# Patient Record
Sex: Female | Born: 1935 | Race: Black or African American | Hispanic: No | Smoking: Never smoker
Health system: Southern US, Community
[De-identification: ages and names within clinical notes are randomized; demographics above are authoritative.]

## PROBLEM LIST (undated history)

## (undated) DIAGNOSIS — I639 Cerebral infarction, unspecified: Secondary | ICD-10-CM

## (undated) DIAGNOSIS — E119 Type 2 diabetes mellitus without complications: Secondary | ICD-10-CM

## (undated) DIAGNOSIS — C801 Malignant (primary) neoplasm, unspecified: Secondary | ICD-10-CM

## (undated) DIAGNOSIS — I480 Paroxysmal atrial fibrillation: Secondary | ICD-10-CM

## (undated) DIAGNOSIS — I10 Essential (primary) hypertension: Secondary | ICD-10-CM

## (undated) HISTORY — PX: ABDOMINAL HYSTERECTOMY: SHX81

## (undated) HISTORY — DX: Type 2 diabetes mellitus without complications: E11.9

---

## 1997-06-23 ENCOUNTER — Ambulatory Visit (HOSPITAL_COMMUNITY): Admission: RE | Admit: 1997-06-23 | Discharge: 1997-06-23 | Payer: Self-pay | Admitting: Gynecology

## 1997-12-21 ENCOUNTER — Other Ambulatory Visit: Admission: RE | Admit: 1997-12-21 | Discharge: 1997-12-21 | Payer: Self-pay | Admitting: Gynecology

## 1997-12-21 ENCOUNTER — Ambulatory Visit: Admission: RE | Admit: 1997-12-21 | Discharge: 1997-12-21 | Payer: Self-pay | Admitting: Gynecology

## 1998-04-03 ENCOUNTER — Encounter: Payer: Self-pay | Admitting: Gynecology

## 1998-04-03 ENCOUNTER — Ambulatory Visit (HOSPITAL_COMMUNITY): Admission: RE | Admit: 1998-04-03 | Discharge: 1998-04-03 | Payer: Self-pay | Admitting: Gynecology

## 1998-04-11 ENCOUNTER — Ambulatory Visit: Admission: RE | Admit: 1998-04-11 | Discharge: 1998-04-11 | Payer: Self-pay | Admitting: Gynecology

## 1998-04-11 ENCOUNTER — Other Ambulatory Visit: Admission: RE | Admit: 1998-04-11 | Discharge: 1998-04-11 | Payer: Self-pay | Admitting: Gynecology

## 1998-07-26 ENCOUNTER — Ambulatory Visit: Admission: RE | Admit: 1998-07-26 | Discharge: 1998-07-26 | Payer: Self-pay | Admitting: Gynecology

## 1998-07-27 ENCOUNTER — Other Ambulatory Visit: Admission: RE | Admit: 1998-07-27 | Discharge: 1998-07-27 | Payer: Self-pay | Admitting: Neurology

## 1998-08-02 ENCOUNTER — Encounter: Payer: Self-pay | Admitting: Gynecology

## 1998-08-02 ENCOUNTER — Ambulatory Visit (HOSPITAL_COMMUNITY): Admission: RE | Admit: 1998-08-02 | Discharge: 1998-08-02 | Payer: Self-pay | Admitting: Gynecology

## 1998-10-12 ENCOUNTER — Ambulatory Visit (HOSPITAL_COMMUNITY): Admission: RE | Admit: 1998-10-12 | Discharge: 1998-10-12 | Payer: Self-pay | Admitting: Gynecology

## 1998-10-12 ENCOUNTER — Encounter: Payer: Self-pay | Admitting: Gynecology

## 1998-10-17 ENCOUNTER — Ambulatory Visit: Admission: RE | Admit: 1998-10-17 | Discharge: 1998-10-17 | Payer: Self-pay | Admitting: Gynecology

## 1998-10-19 ENCOUNTER — Other Ambulatory Visit: Admission: RE | Admit: 1998-10-19 | Discharge: 1998-10-19 | Payer: Self-pay | Admitting: Gynecology

## 1999-04-11 ENCOUNTER — Ambulatory Visit: Admission: RE | Admit: 1999-04-11 | Discharge: 1999-04-11 | Payer: Self-pay | Admitting: Gynecology

## 1999-04-11 ENCOUNTER — Other Ambulatory Visit: Admission: RE | Admit: 1999-04-11 | Discharge: 1999-04-11 | Payer: Self-pay | Admitting: Gynecology

## 1999-09-12 ENCOUNTER — Encounter: Payer: Self-pay | Admitting: *Deleted

## 1999-09-12 ENCOUNTER — Ambulatory Visit (HOSPITAL_COMMUNITY): Admission: RE | Admit: 1999-09-12 | Discharge: 1999-09-12 | Payer: Self-pay | Admitting: *Deleted

## 1999-10-08 ENCOUNTER — Ambulatory Visit (HOSPITAL_COMMUNITY): Admission: RE | Admit: 1999-10-08 | Discharge: 1999-10-08 | Payer: Self-pay | Admitting: Gynecology

## 1999-10-09 ENCOUNTER — Ambulatory Visit (HOSPITAL_COMMUNITY): Admission: RE | Admit: 1999-10-09 | Discharge: 1999-10-09 | Payer: Self-pay | Admitting: Gynecology

## 1999-10-09 ENCOUNTER — Encounter: Payer: Self-pay | Admitting: Gynecology

## 1999-10-10 ENCOUNTER — Other Ambulatory Visit: Admission: RE | Admit: 1999-10-10 | Discharge: 1999-10-10 | Payer: Self-pay | Admitting: Gynecology

## 1999-10-10 ENCOUNTER — Ambulatory Visit: Admission: RE | Admit: 1999-10-10 | Discharge: 1999-10-10 | Payer: Self-pay | Admitting: Gynecology

## 2000-04-09 ENCOUNTER — Ambulatory Visit: Admission: RE | Admit: 2000-04-09 | Discharge: 2000-04-09 | Payer: Self-pay | Admitting: Gynecology

## 2000-04-09 ENCOUNTER — Other Ambulatory Visit: Admission: RE | Admit: 2000-04-09 | Discharge: 2000-04-09 | Payer: Self-pay | Admitting: Gynecology

## 2000-07-27 ENCOUNTER — Emergency Department (HOSPITAL_COMMUNITY): Admission: EM | Admit: 2000-07-27 | Discharge: 2000-07-27 | Payer: Self-pay | Admitting: Emergency Medicine

## 2000-10-06 ENCOUNTER — Ambulatory Visit (HOSPITAL_COMMUNITY): Admission: RE | Admit: 2000-10-06 | Discharge: 2000-10-06 | Payer: Self-pay | Admitting: Gynecology

## 2000-10-07 ENCOUNTER — Ambulatory Visit (HOSPITAL_COMMUNITY): Admission: RE | Admit: 2000-10-07 | Discharge: 2000-10-07 | Payer: Self-pay | Admitting: Gynecology

## 2000-10-07 ENCOUNTER — Encounter: Payer: Self-pay | Admitting: Gynecology

## 2000-10-14 ENCOUNTER — Other Ambulatory Visit: Admission: RE | Admit: 2000-10-14 | Discharge: 2000-10-14 | Payer: Self-pay | Admitting: Gynecology

## 2000-10-14 ENCOUNTER — Ambulatory Visit: Admission: RE | Admit: 2000-10-14 | Discharge: 2000-10-14 | Payer: Self-pay | Admitting: Gynecology

## 2000-10-17 ENCOUNTER — Ambulatory Visit (HOSPITAL_COMMUNITY): Admission: RE | Admit: 2000-10-17 | Discharge: 2000-10-17 | Payer: Self-pay | Admitting: Gynecology

## 2000-10-17 ENCOUNTER — Encounter: Payer: Self-pay | Admitting: Gynecology

## 2001-04-14 ENCOUNTER — Other Ambulatory Visit: Admission: RE | Admit: 2001-04-14 | Discharge: 2001-04-14 | Payer: Self-pay | Admitting: Gynecology

## 2001-04-14 ENCOUNTER — Ambulatory Visit: Admission: RE | Admit: 2001-04-14 | Discharge: 2001-04-14 | Payer: Self-pay | Admitting: Gynecology

## 2001-04-14 ENCOUNTER — Encounter (INDEPENDENT_AMBULATORY_CARE_PROVIDER_SITE_OTHER): Payer: Self-pay | Admitting: *Deleted

## 2001-10-02 ENCOUNTER — Ambulatory Visit (HOSPITAL_COMMUNITY): Admission: RE | Admit: 2001-10-02 | Discharge: 2001-10-02 | Payer: Self-pay | Admitting: Gynecology

## 2001-10-06 ENCOUNTER — Ambulatory Visit (HOSPITAL_COMMUNITY): Admission: RE | Admit: 2001-10-06 | Discharge: 2001-10-06 | Payer: Self-pay | Admitting: Gynecology

## 2001-10-06 ENCOUNTER — Encounter: Payer: Self-pay | Admitting: Gynecology

## 2001-12-01 ENCOUNTER — Encounter (INDEPENDENT_AMBULATORY_CARE_PROVIDER_SITE_OTHER): Payer: Self-pay | Admitting: *Deleted

## 2001-12-01 ENCOUNTER — Ambulatory Visit: Admission: RE | Admit: 2001-12-01 | Discharge: 2001-12-01 | Payer: Self-pay | Admitting: Gynecology

## 2001-12-01 ENCOUNTER — Other Ambulatory Visit: Admission: RE | Admit: 2001-12-01 | Discharge: 2001-12-01 | Payer: Self-pay | Admitting: Gynecology

## 2002-11-17 ENCOUNTER — Ambulatory Visit: Admission: RE | Admit: 2002-11-17 | Discharge: 2002-11-17 | Payer: Self-pay | Admitting: Gynecology

## 2002-11-17 ENCOUNTER — Encounter (INDEPENDENT_AMBULATORY_CARE_PROVIDER_SITE_OTHER): Payer: Self-pay | Admitting: *Deleted

## 2002-11-17 ENCOUNTER — Other Ambulatory Visit: Admission: RE | Admit: 2002-11-17 | Discharge: 2002-11-17 | Payer: Self-pay | Admitting: Gynecology

## 2003-09-03 ENCOUNTER — Emergency Department (HOSPITAL_COMMUNITY): Admission: EM | Admit: 2003-09-03 | Discharge: 2003-09-03 | Payer: Self-pay | Admitting: Emergency Medicine

## 2003-11-23 ENCOUNTER — Other Ambulatory Visit: Admission: RE | Admit: 2003-11-23 | Discharge: 2003-11-23 | Payer: Self-pay | Admitting: Gynecology

## 2003-11-23 ENCOUNTER — Encounter (INDEPENDENT_AMBULATORY_CARE_PROVIDER_SITE_OTHER): Payer: Self-pay | Admitting: *Deleted

## 2003-11-23 ENCOUNTER — Ambulatory Visit: Admission: RE | Admit: 2003-11-23 | Discharge: 2003-11-23 | Payer: Self-pay | Admitting: Gynecology

## 2004-10-22 ENCOUNTER — Ambulatory Visit (HOSPITAL_COMMUNITY): Admission: RE | Admit: 2004-10-22 | Discharge: 2004-10-22 | Payer: Self-pay | Admitting: Gastroenterology

## 2004-12-04 ENCOUNTER — Ambulatory Visit: Admission: RE | Admit: 2004-12-04 | Discharge: 2004-12-04 | Payer: Self-pay | Admitting: Gynecology

## 2004-12-04 ENCOUNTER — Encounter (INDEPENDENT_AMBULATORY_CARE_PROVIDER_SITE_OTHER): Payer: Self-pay | Admitting: *Deleted

## 2004-12-04 ENCOUNTER — Other Ambulatory Visit: Admission: RE | Admit: 2004-12-04 | Discharge: 2004-12-04 | Payer: Self-pay | Admitting: Gynecology

## 2006-05-14 ENCOUNTER — Encounter (INDEPENDENT_AMBULATORY_CARE_PROVIDER_SITE_OTHER): Payer: Self-pay | Admitting: *Deleted

## 2006-05-14 ENCOUNTER — Ambulatory Visit: Admission: RE | Admit: 2006-05-14 | Discharge: 2006-05-14 | Payer: Self-pay | Admitting: Gynecology

## 2006-05-14 ENCOUNTER — Other Ambulatory Visit: Admission: RE | Admit: 2006-05-14 | Discharge: 2006-05-14 | Payer: Self-pay | Admitting: Gynecology

## 2006-12-24 ENCOUNTER — Inpatient Hospital Stay (HOSPITAL_COMMUNITY): Admission: EM | Admit: 2006-12-24 | Discharge: 2007-01-08 | Payer: Self-pay | Admitting: Emergency Medicine

## 2006-12-25 ENCOUNTER — Encounter (INDEPENDENT_AMBULATORY_CARE_PROVIDER_SITE_OTHER): Payer: Self-pay | Admitting: Internal Medicine

## 2006-12-27 ENCOUNTER — Ambulatory Visit: Payer: Self-pay | Admitting: Surgery

## 2006-12-31 ENCOUNTER — Encounter: Payer: Self-pay | Admitting: Surgery

## 2007-01-27 ENCOUNTER — Encounter: Admission: RE | Admit: 2007-01-27 | Discharge: 2007-01-27 | Payer: Self-pay | Admitting: Surgery

## 2007-01-27 ENCOUNTER — Ambulatory Visit: Payer: Self-pay | Admitting: Surgery

## 2007-05-26 ENCOUNTER — Other Ambulatory Visit: Admission: RE | Admit: 2007-05-26 | Discharge: 2007-05-26 | Payer: Self-pay | Admitting: Gynecology

## 2007-05-26 ENCOUNTER — Ambulatory Visit: Admission: RE | Admit: 2007-05-26 | Discharge: 2007-05-26 | Payer: Self-pay | Admitting: Gynecology

## 2007-05-26 ENCOUNTER — Encounter: Payer: Self-pay | Admitting: Gynecology

## 2008-05-06 IMAGING — CR DG CHEST 2V
2 series · 2 of 2 positions shown · non-contrast
Comparison: 01/02/07.

CLINICAL DATA: Status post median sternotomy. 
 TWO VIEW CHEST:

[w chest pa]
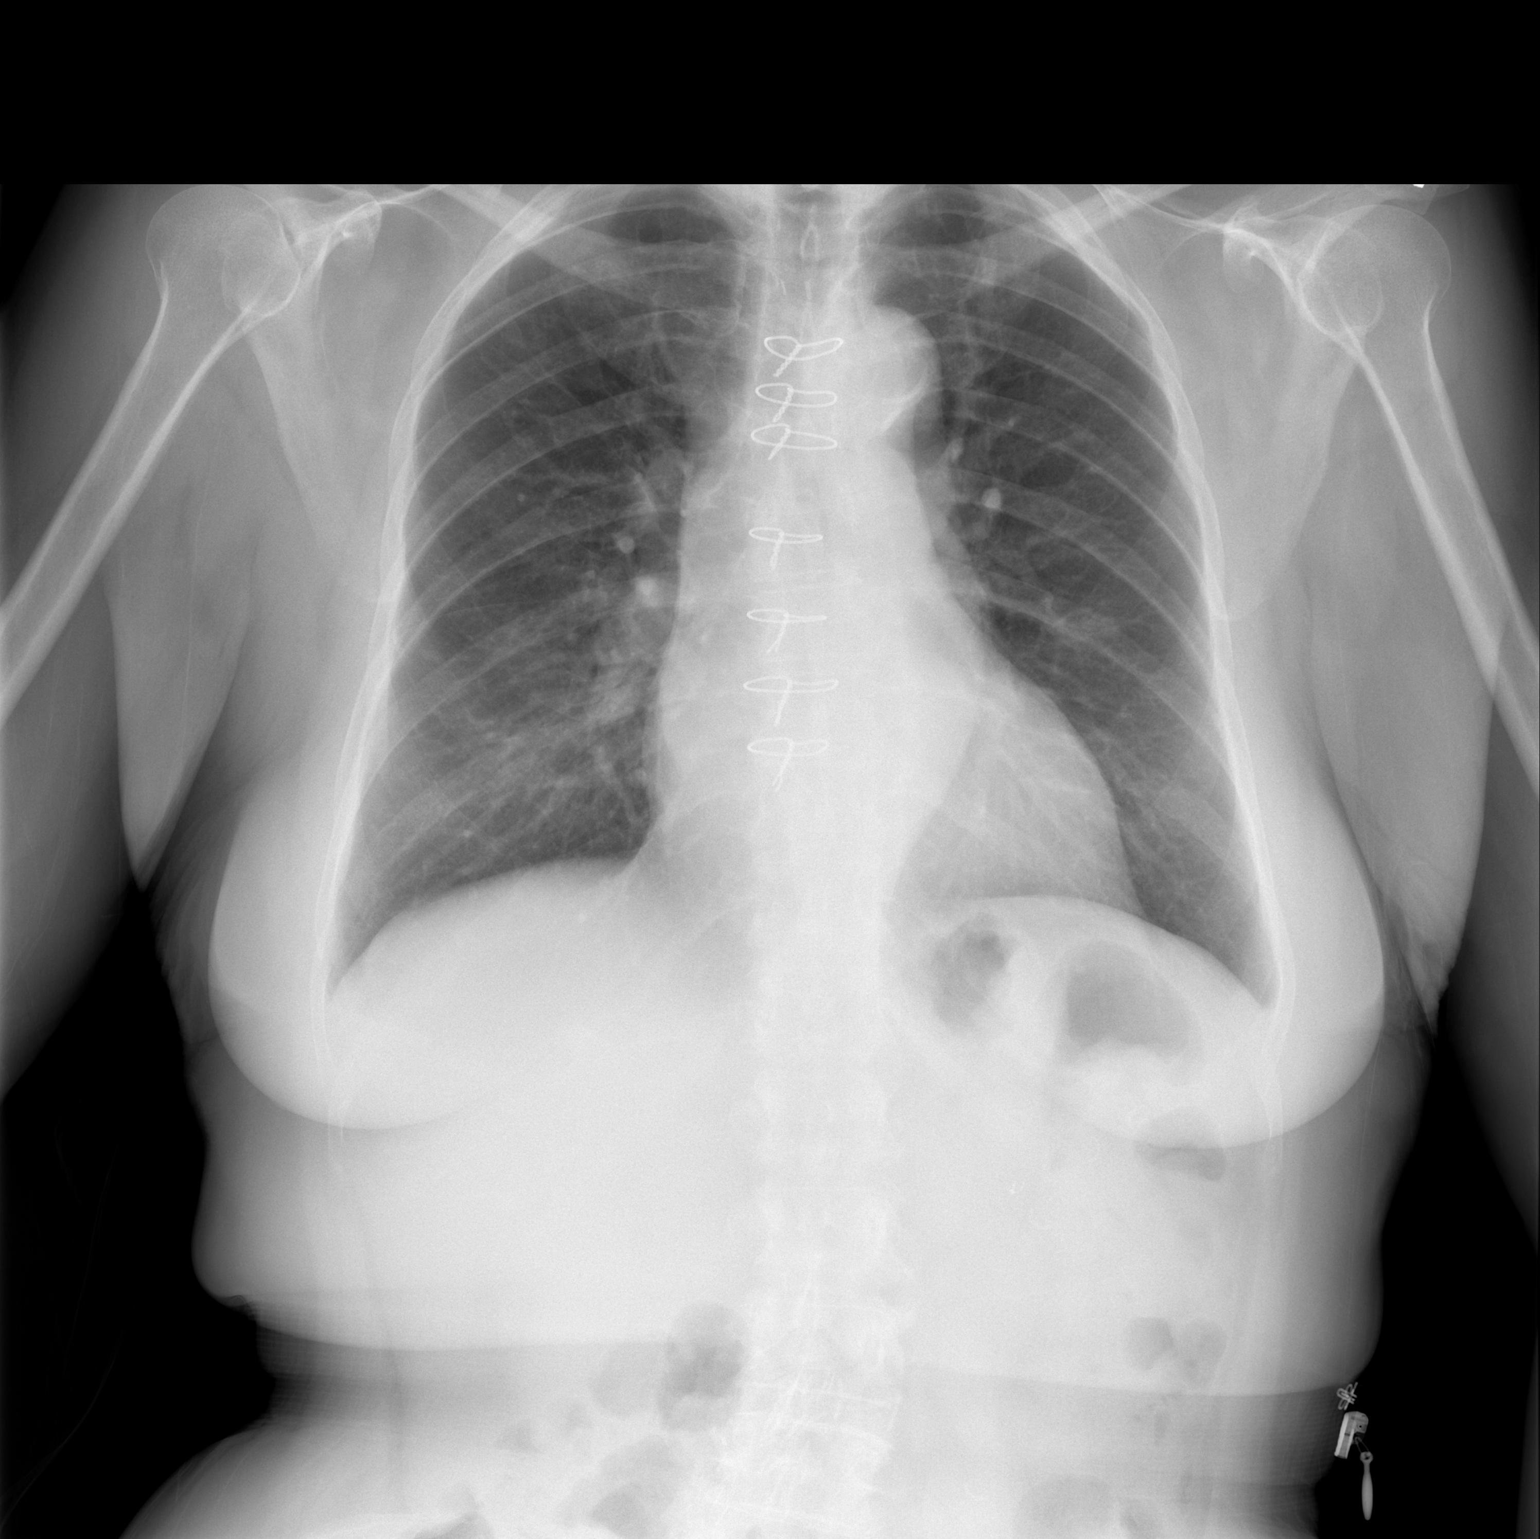

[w chest lat]
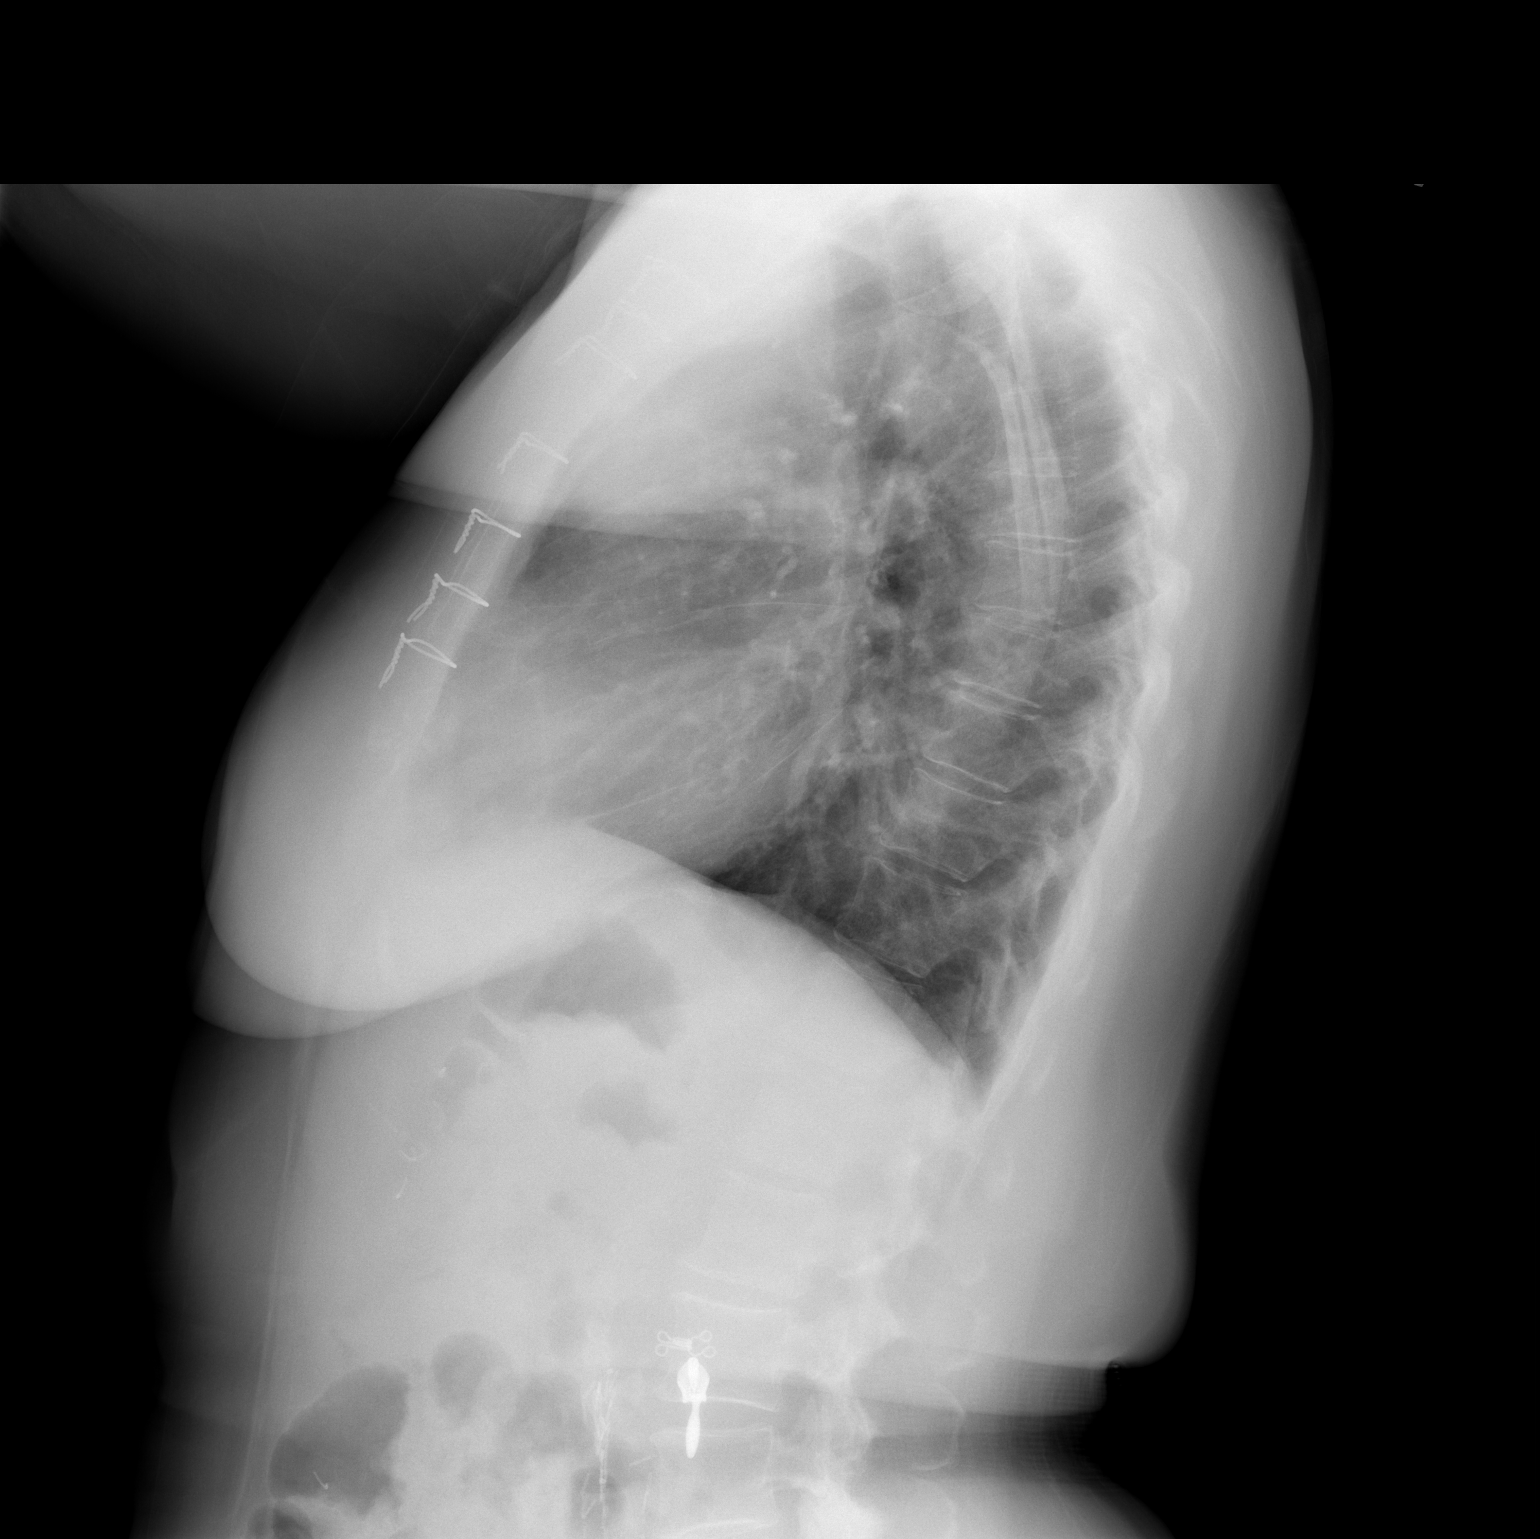

[2 of 2 positions shown; findings below may reference images not displayed]

The patient has undergone a previous median sternotomy.  The heart is normal in size.  The mediastinal structures have a normal appearance postoperatively. There is no pneumothorax and the lungs are well aerated postoperatively.
IMPRESSION: Satisfactory postoperative chest.

## 2008-06-11 ENCOUNTER — Emergency Department (HOSPITAL_COMMUNITY): Admission: EM | Admit: 2008-06-11 | Discharge: 2008-06-11 | Payer: Self-pay | Admitting: Emergency Medicine

## 2008-07-03 ENCOUNTER — Encounter: Admission: RE | Admit: 2008-07-03 | Discharge: 2008-07-03 | Payer: Self-pay | Admitting: Internal Medicine

## 2008-07-07 ENCOUNTER — Encounter: Admission: RE | Admit: 2008-07-07 | Discharge: 2008-07-07 | Payer: Self-pay | Admitting: Internal Medicine

## 2008-07-22 ENCOUNTER — Ambulatory Visit (HOSPITAL_COMMUNITY): Admission: RE | Admit: 2008-07-22 | Discharge: 2008-07-22 | Payer: Self-pay | Admitting: Internal Medicine

## 2010-03-12 ENCOUNTER — Ambulatory Visit: Payer: Self-pay | Admitting: Cardiology

## 2010-03-12 ENCOUNTER — Ambulatory Visit (HOSPITAL_COMMUNITY)
Admission: RE | Admit: 2010-03-12 | Discharge: 2010-03-12 | Payer: Self-pay | Source: Home / Self Care | Admitting: Cardiology

## 2010-07-25 ENCOUNTER — Other Ambulatory Visit: Payer: Self-pay | Admitting: Internal Medicine

## 2010-07-25 DIAGNOSIS — Z1231 Encounter for screening mammogram for malignant neoplasm of breast: Secondary | ICD-10-CM

## 2010-08-14 LAB — POCT CARDIAC MARKERS
CKMB, poc: <1 ng/mL — ABNORMAL LOW (ref 1.0–8.0)
Troponin i, poc: <0.05 ng/mL (ref 0.00–0.09)

## 2010-08-17 ENCOUNTER — Ambulatory Visit
Admission: RE | Admit: 2010-08-17 | Discharge: 2010-08-17 | Disposition: A | Discharge status: Home / Self Care | Payer: Medicare Other | Source: Ambulatory Visit | Attending: Internal Medicine | Admitting: Internal Medicine

## 2010-08-17 DIAGNOSIS — Z1231 Encounter for screening mammogram for malignant neoplasm of breast: Secondary | ICD-10-CM

## 2010-09-11 NOTE — Assessment & Plan Note (Signed)
OFFICE VISIT   Brandi Griffin, Brandi Griffin  DOB:  [birth year]                                        January 27, 2007  CHART #:  [id number]   OFFICE VISIT:  The patient returns for follow up status post resection  of a large left atrial tumor with reconstruction of the intraatrial  septum and posterior left atrial wall using a pericardial patch on  12/31/2006.  She had an uncomplicated postoperative course.  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  She  had an uneventful postoperative recovery and said that she has been  feeling  much better since discharge.  She has noted that her blood  pressure has been rising slowly.  She was not sent home on her blood  pressure medication due to her relatively low blood pressure in the  hospital.  She denies any chest pain or shortness of breath.   PHYSICAL EXAMINATION:  On physical examination, her blood pressure  186/90.  Repeat was 178/80.  Pulse is 92 and regular.  Respiratory rate  is 18 and unlabored.  Oxygen saturation is 95% on room air.  She looks  well.  Cardiac exam shows a regular rate and rhythm with normal heart  sounds.  Her lung exam is clear.  The chest incision is healing well and  the sternum is stable.  There is no peripheral edema.   Follow up chest x-ray shows clear lung fields and no pleural effusions.   CURRENT MEDICATIONS:  Lipitor 40 mg daily, Activa 50 mg daily,  Glucophage 1000 mg two times a day, aspirin, and a multivitamin daily.  I asked her to resume her Lisinopril 40 mg daily and hydrochlorothiazide  25 mg daily, which she had been on preoperatively for hypertension.   IMPRESSION:  Overall the patient is recovering well from her surgery.  She is hypertensive and I asked her to resume her previous blood  pressure medications.  She has a follow up appointment with Dr. Amil Amen  this Thursday so she can have her blood pressure rechecked and any  further  adjustments made.  She had a completely resected left atrial  myxoma.  Her risk of recurrence is very low.  She probably should have a  repeat echo in about one year to follow up on this to ensure there are  no signs of recurrence at that time.  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  She should refrain from lifting anything  heavier than 10 pounds for a total of 3 months due to surgery.  She will  continue to follow up with Dr. Amil Amen and Dr. Nehemiah Settle, and will contact  me if she develops any problems with her incisions.   Evelene Croon, M.D.  Electronically Signed   BB/MEDQ  D:  01/27/2007  T:  01/27/2007  Job:  [id number]   cc:   Francisca December, M.D.  Deirdre Peer. Polite, M.D.

## 2010-09-11 NOTE — Consult Note (Signed)
NAME:  Brandi Griffin, Brandi Griffin NO.:  [id number]   MEDICAL RECORD NO.:  [id number]          PATIENT TYPE:  OUT   LOCATION:  GYN                          FACILITY:  [facility]   PHYSICIAN:  De Blanch, M.D.DATE OF BIRTH:  1935-01-19   DATE OF CONSULTATION:  05/26/2007  DATE OF DISCHARGE:  05/26/2007                                 CONSULTATION   CHIEF COMPLAINT:  Endometrial cancer.   HISTORY:  The patient returns today for continuing follow-up of stage IV  endometrial cancer.  Since her last visit she has done well.  She denies  any GI or GU symptoms.  There is no pelvic pain or pressure, vaginal  bleeding or discharge.   The patient did have a significant health event over this past year, and  was found to have a myxoma in her heart requiring open heart surgery.  She seems to have recovered well from this.   HISTORY OF PRESENT ILLNESS:  The patient underwent initial surgery at  [facility] February 1998.  She was found to have pelvic and  periaortic lymph node metastasis and was treated with extended field  radiation therapy and Megace.  She is also found to have pulmonary  metastasis which responded completely to Megace.   PAST MEDICAL HISTORY/MEDICAL ILLNESSES:  1. Diabetes.  2. Hyperlipidemia.  3. Hypertension.  4. Cardiac myxoma.   DRUG ALLERGIES:  None.   FAMILY HISTORY:  Negative for gynecologic, breast or colon cancer.   CURRENT MEDICATIONS:  Actos, Norvasc, insulin, metformin,  hydrochlorothiazide, lisinopril and Crestor.   SOCIAL HISTORY:  The patient lives with a daughter.  She does not smoke.   REVIEW OF SYSTEMS:  A 10-point coverage review of systems negative  except as noted above.   PHYSICAL EXAMINATION:  VITAL SIGNS:  Weight 143 pounds, blood pressure  150/72.  GENERAL:  Patient is a healthy African American female no acute  distress.  HEENT:  Negative.  NECK:  Supple without thyromegaly or supraclavicular, clavicular or  inguinal adenopathy.  ABDOMEN:  Soft, nontender.  No mass, organomegaly, ascites or hernias  noted.  PELVIC:  EG/BUS vagina, urethra are normal.  Cervix and uterus are  surgically absent.  Adnexa without masses.  Rectovaginal exam confirms.  LOWER EXTREMITIES:  Without edema or varicosities.   IMPRESSION:  Stage IV endometrial cancer 1998.  No evidence of recurrent  disease.  The patient will return to see me in one year.  Pap smears  were obtained today.      De Blanch, M.D.  Electronically Signed     DC/MEDQ  D:  05/28/2007  T:  05/28/2007  Job:  [id number]   cc:   Deirdre Peer. Polite, M.D.   Telford Nab, R.N.  [address]  [place], [place] [address]

## 2010-09-11 NOTE — H&P (Signed)
NAMESHANTA, HARTNER NO.:  [id number]   MEDICAL RECORD NO.:  [id number]          PATIENT TYPE:  EMS   LOCATION:  ED                           FACILITY:  [facility]   PHYSICIAN:  Hollice Espy, M.D.DATE OF BIRTH:  1934/07/09   DATE OF ADMISSION:  12/24/2006  DATE OF DISCHARGE:                              HISTORY & PHYSICAL   PRIMARY CARE PHYSICIAN:  Deirdre Peer. Polite, M.D.   CHIEF COMPLAINT:  Shortness of breath.   HISTORY OF PRESENT ILLNESS:  The patient is a 76 year old white female  with past medical history of hypertension, diabetes mellitus, and  hyperlipidemia who has been feeling well with no complaints for the last  several days.  She denies any fevers, chills, cough, shortness of  breath, lower extremity swelling and seems to be otherwise doing well  until today when she noted that she seemed to be short of breath.  She  had no chest pain and again no other symptoms, but appeared to be with  any kind of exertion she started feeling short of breath.  It got to the  point where she felt extremely winded doing even small activities and  came into the emergency room for further evaluation.  In the emergency  room, when walking across the ER to her room she felt even short of  breath then.  She was noted to be having normal oxygen saturations with  saturations of 98-100% on 2 liters to even room air.  Labs were ordered  on the patient and she was found to have normal electrolytes and renal  function, normal white count with no shift.  Her hemoglobin was  unremarkable.  Her BNP was only minimally elevated at 234 and a D-dimer  was slightly elevated at 0.98.  Cardiac markers were fine as was EKG.  The patient underwent a chest x-ray which showed nonspecific bibasilar  infiltrates consistent with a possible atypical pneumonia.  Because of  her elevation in D-dimer and sudden onset of shortness of breath,  Lear Ng, M.D., the ER physician was concerned  about the  possibility of a pulmonary embolism and had the patient undergo CT  angiogram of the chest.  Preliminary report from the radiologist stated  that the CT scan while negative for pulmonary embolism, showed a  possible mass in the right atrium concerning for a possible tumor such  as a myxoma.  With these findings, I discussed this with the patient and  we felt it was best that she come in for further testing.  Currently she  herself is doing well.  She denies any headaches, vision changes,  dysphagia, chest pain, chest pain, palpitations.  She says her breathing  is much better now, but she does admit, however, that she is sitting in  bed and has oxygen on, but denies any wheezing, coughing, abdominal  pain, hematuria, dysuria, constipation, diarrhea, focal extremity  numbness, weakness, pain, or swelling.   REVIEW OF SYSTEMS:  Otherwise negative.   PAST MEDICAL HISTORY:  Diabetes mellitus, hypertension, hyperlipidemia,  and history of endometrial cancer with metastasis to the lung, status  post full treatment and she has been cancer free for almost 10 years.   ALLERGIES:  No known drug allergies.   SOCIAL HISTORY:  She denies any tobacco, alcohol, or drug use.   MEDICATIONS:  1. Actos 30 mg.  2. Crestor 20 mg.  3. Hydrochlorothiazide 25 mg.  4. Lisinopril 40 mg.  5. Metformin 2000 mg.  6. Multivitamin.   FAMILY HISTORY:  Noncontributory.   PHYSICAL EXAMINATION:  VITAL SIGNS:  Temperature 98.5, heart rate 78,  blood pressure 134/70, respirations 20, O2 saturation 100% on room air,  and 98% on 2 liters.  GENERAL:  She is alert and oriented x3 in no acute distress.  HEENT:  Normocephalic and atraumatic.  Mucous membranes are moist.  She  has no carotid bruits.  HEART:  Regular rhythm with a noted S3 or S4 murmur difficult to  ascertain, but she definitely has a split S2 heart sound.  She, however,  tells me that this has gone on for most of her life and this is not  a  new finding.  LUNGS:  She has decreased breath sounds at the bases.  ABDOMEN:  Soft, nontender, obese, positive bowel sounds.  EXTREMITIES:  No cyanosis, clubbing, or edema.  She has good 2+ pulses.   X-ray and CT are as HPI although the final report on her CT is not back  yet.   LABORATORY DATA:  Sodium 139, potassium 3.7, chloride 104, bicarb 29,  BUN 12, creatinine 0.7, glucose 145.  White count 9.1, H&H 12.5 and 37,  MCV 81, platelet count 350 with no shift.  Coags are unremarkable.  D-  dimer is slightly elevated at 0.98.  BNP is slightly elevated at 234.  CPK 92.6, MB 1.5, troponin I less than 0.05.   ASSESSMENT:  1. Atrial mass.  This may possibly be the cause of the patient's      shortness of breath and/or dyspnea issues.  We will plan to admit      the patient.  I will discuss with cardiology tonight as to whether      we need a two-dimensional echocardiogram versus a TEE to best      ascertain.  This is concerning in a patient with a previous history      of carcinoma.  We will need further follow-up.  In addition, we      will also be able to assess with her echocardiogram her ejection      fraction.  2. Dyspnea on exertion, sudden onset.  Noted the patient's x-ray this      may possibly end up being an atypical pneumonia with no findings      per echo.  If that is the case, we will treat symptomatically.      Again because of her slightly elevated BNP, her echocardiogram will      best assess her cardiac function.  3. Diabetes mellitus.  Continue medications plus sliding scale.  4. Hypertension.  Continue medications.      Hollice Espy, M.D.  Electronically Signed     SKK/MEDQ  D:  12/24/2006  T:  12/25/2006  Job:  [id number]   cc:   Deirdre Peer. Polite, M.D.

## 2010-09-11 NOTE — Cardiovascular Report (Signed)
Brandi, Griffin             ACCOUNT NO.:  [id number]   MEDICAL RECORD NO.:  [id number]          PATIENT TYPE:  INP   LOCATION:  3732                         FACILITY:  [facility]   PHYSICIAN:  Francisca December, M.D.  DATE OF BIRTH:  07-09-1934   DATE OF PROCEDURE:  12/30/2006  DATE OF DISCHARGE:                            CARDIAC CATHETERIZATION   PROCEDURE PERFORMED:  1. Right and left heart catheterization.  2. Left ventriculogram.  3. Coronary angiography.  4. Percutaneous closure of right femoral artery.   INDICATIONS:  [name] is a 76 year old woman who was admitted to  [facility] after an episode of severe dyspnea.  This prompted  a CT angiogram of the chest which identified a left atrial mass.  Subsequent echocardiography has identified this as a mobile large left  atrial myxoma.  She is therefore brought to the catheterization  laboratory at this time to identify the extent of disease and provide  for further therapeutic options.   PROCEDURAL NOTE:  The patient was brought to the cardiac catheterization  laboratory in the fasting state.  The right groin was prepped and draped  the usual sterile fashion.  Local anesthesia was obtained with  infiltration of 1% lidocaine.  The 6-French catheter sheaths were  inserted percutaneously into the right femoral artery and right femoral  vein utilizing an anterior approach over guiding J-wire.  A 100-cm  multipurpose A1 catheter was then manipulated through the right heart  chambers to the pulmonary artery wedge position.  Pressure was recorded  with the catheter in the pulmonary artery wedge position, the main  pulmonary artery, the right ventricle, and the right atrium.  Blood  samples for oxygen saturation determination were obtained from the  superior vena cava and the pulmonary artery.  A 110-cm pigtail catheter  was then advanced to the ascending aorta, where pressures were recorded,  and a blood sample obtained  for oxygen saturation.  The catheter was  then prolapsed across the aortic valve and the left ventricle and  simultaneous wedge and LV tracings were obtained.  The right heart  catheter was then removed from the wedge position and a 30-degree RAO  cine left ventriculogram performed utilizing a power injector.  Forty  milliliters were injected at 12 mL per second.  Pressures were recorded  as the pigtail catheter was withdrawn back across the aortic valve and  it was then removed and exchanged for a 6-French #3.5 left Judkins  catheter.  Cineangiography of the left coronary artery was conducted in  LAO and RAO projections.  The left Judkins catheter was exchanged for a  6-French #4 right Judkins catheter.  Cineangiography of the right  coronary artery was conducted in LAO and RAO projections.  There was  catheter damping on cannulation of the right coronary and intracoronary  nitroglycerin 0.2 mg was administered.  This did relieve some of the  catheter damping, but did not completely resolve it.  Several angiograms  in the LAO projection while pullback of the catheter failed to reveal  any significant lesion; this was after administration of IV  nitroglycerin.  A right femoral arteriogram was then performed in the RAO projection via  the catheter sheath by hand injection; this documented adequate anatomy  for placement of the percutaneous closure device, Angio-Seal.  This was  subsequently deployed with good hemostasis and intact distal pulse.  The  patient was then transported to the recovery area in stable condition.   HEMODYNAMIC RESULTS:  Right heart pressures were as follows:  Right  atrial pressure A wave 7 mmHg, V wave 5 mmHg, mean 4 mmHg.  Right  ventricular pressure was 59/5 mmHg.  Pulmonary pressure was 56/18 mmHg  with a mean of 35 mmHg.  Pulmonary artery wedge position was A wave 26  mmHg, V wave 33 mmHg, mean 23 mmHg.  Aortic pressure:  Central aortic  pressure was 126/63  with a mean of 89 mmHg.  Left ventricular end-  diastolic pressure was 6 mmHg.   The superior vena cava oxygen saturation was 63%.  Pulmonary artery  saturation was 66% and aortic saturation 93%.  This resulted in a Fick  cardiac output of 3.5 L per minute.  The transmitral mean gradient was  16.4 mmHg.  Utilizing the Fick principle and the Gorlin formula, a  calculated effective mitral valve area was 0.72 cm2.   ANGIOGRAPHY:  The left ventriculogram demonstrated normal chamber size  and hyperdynamic systolic function.  A visual estimate of the ejection  fraction due to the presence of ventricular tachycardia is 70%.  There  are no regional wall motion abnormalities.  There is mitral annular  calcification seen and the aortic valve is trileaflet, but does open  normally during systole.  There is no coronary artery calcification  seen.   There is a right-dominant coronary system present.  The main left  coronary artery is normal.   The left anterior descending artery and its branches are normal.  Two  small to moderate-sized diagonal branches arise without any obstruction  seen.  The ongoing anterior descending artery reaches and traverses the  apex.   The left circumflex coronary artery and its branches are also without  any obstruction.  There is a large ramus intermedius that is the  dominant vessel on the lateral wall of the heart.  The left circumflex  itself is relatively small and gives rise to one small to moderate-sized  marginal branch, and then a very small posterolateral branch.   The right coronary artery, as mentioned initially, demonstrate a  pressure damping and a 30% to 40% tubular narrowing at the ostium.  The  right coronary itself is somewhat tortuous, but I see no obstructions  whatsoever.  Distally, the vessel subdivides into a moderate to large  posterior descending artery and a moderate-to-large posterolateral  segment which gives rise to 1 large  posterolateral branch.  Following  intracoronary nitroglycerin, a slow pullback to the right coronary  catheter revealed perhaps a 20% narrowing at the ostium of the right  coronary.  As mentioned, there was still minor pressure damping at the  completion.   FINAL IMPRESSION:  1. Essentially normal coronary arteries, minimal atherosclerotic      disease of the right coronary ostium.  2. Intact left ventricular size and global systolic function.  3. Moderately elevated right ventricular and pulmonary artery      pressures with significantly elevated pulmonary wedge pressure      secondary to left atrial outflow obstruction by the myxoma.  4. Effective mitral valve area is 0.7 due to obstruction with the  myxoma.   PLAN/RECOMMENDATIONS:  The case will be referred to [facility] [place] and Dr. Evelene Croon is aware.      Francisca December, M.D.  Electronically Signed     JHE/MEDQ  D:  12/30/2006  T:  12/30/2006  Job:  [id number]   cc:   Deirdre Peer. Polite, M.D.  Evelene Croon, M.D.

## 2010-09-11 NOTE — Consult Note (Signed)
NAMECHARLY, HUNTON NO.:  [id number]   MEDICAL RECORD NO.:  [id number]          PATIENT TYPE:  INP   LOCATION:  3732                         FACILITY:  [facility]   PHYSICIAN:  Evelene Croon, M.D.     DATE OF BIRTH:  23-Nov-1934   DATE OF CONSULTATION:  12/27/2006  DATE OF DISCHARGE:                                 CONSULTATION   REFERRING PHYSICIAN:  Francisca December, M.D.   REASON FOR CONSULTATION:  Left atrial tumor.   CLINICAL HISTORY:  I was asked to evaluate this 76 year old woman with a  large left atrial tumor for consideration of surgery.  She says that she  was in her usual state of health until a couple days prior to admission  when she began developing worsening shortness of breath.  This was  associated with any type of exertion.  She presented to the emergency  room for further evaluation, known to have normal oxygen saturation on 2  liters and then on room air.  Her BNP was minimally elevated at 234 and  her D-dimer was slightly elevated at 0.98.  Cardiac enzymes and  electrocardiogram were normal.  Chest x-ray showed nonspecific bibasilar  infiltrates consistent with possible atypical pneumonia.  She underwent  a CT scan of the chest due to her sudden onset of shortness of breath  and her elevated D-dimer.  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  She was admitted for further workup and cardiology consultation.  A 2D  echocardiogram was performed and this showed a large left atrial tumor  that appeared to be mobile and partially obstructing the mitral valve.  It was filling the majority of an enlarged left atrium.  There was  significant turbulence around the mass.  There was no significant mitral  regurgitation, but was a gradient of 13 mmHg across the valve.  Left  ventricular function was well preserved.  She was seen by Dr. Amil Amen of  [facility] and cardiac  catheterization was recommended, but the  patient wanted to wait until her daughter was here.   REVIEW OF SYSTEMS:  GENERAL: She has had no fever or chills.  She denies  any recent weight changes.  She denies fatigue.  EYES:  Negative.  ENT:  Negative. ENDOCRINE: She has adult onset diabetes.  She denies  hypothyroidism.  CARDIOVASCULAR:  She denies any chest pain or pressure.  She has had recent onset of progressive exertional dyspnea.  In  retrospect she said that for at least 6 to 12 months she has had some  mild shortness of breath with exertion like going up stairs.  She has  had no PND or orthopnea.  She does have some peripheral edema, which she  attributes to taking Actos.  She denies palpations.  RESPIRATORY:  She  denies cough and sputum production.  She has had no hemoptysis.  GI:  She denies nausea or vomiting.  She has had no melena or bright red  blood per rectum.  GU:  She denies dysuria  and hematuria.  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  NEUROLOGICAL:  She  denies any focal weakness or numbness.  She denies dizziness and  syncope.  She has had no visual changes and no headaches.  PSYCHIATRIC:  Negative.  MUSCULOSKELETAL:  Negative.  HEMATOLOGIC:  She denies any  history of bleeding, sores, or easy bruising.   ALLERGIES:  NONE.   MEDICATIONS:  Prior to admission were:  1. Actos 30 mg daily.  2. Crestor 20 mg daily.  3. HCTZ 25 mg daily.  4. Lisinopril 40 mg daily.  5. Metformin 2000 mg daily.  6. Multivitamin daily.   PAST MEDICAL HISTORY:  Significant for:  1. Diabetes.  2. History of hypertension.  3. Hyperlipidemia.  4. She has history of stage 4 endometrial cancer with metastasis to      the lung status post radicle hysterectomy and postoperative      radiation.  She has been cancer free for almost 10 years.   SOCIAL HISTORY:  She had not smoked in over 20  years.  She denies  alcohol use.  She is single and lives alone.   FAMILY HISTORY:  Negative.   PHYSICAL EXAMINATION:  GENERAL:  She is a well-developed, mildly obese,  black female in no distress.  VITAL SIGNS:  Blood pressure is 135/75 and her pulse is 80 and regular,  respiratory rate is 18 and labored.  HEENT:  Normocephalic, atraumatic.  Pupils are equal and reactive to  light and accommodation.  Extraocular muscles are intact.  Throat is  clear.  NECK:  Shows normal carotid pulses bilaterally.  There are no bruits.  There is no adenopathy or thyromegaly.  CARDIAC:  Shows regular rate and rhythm with normal S1, S2.  There is no  murmur, rub, or gallop.  LUNGS:  Clear.  ABDOMEN:  Shows active bowel sounds.  Abdomen is soft, obese, and  nontender.  There are no palpable masses, organomegaly.  EXTREMITIES:  Shows some mild edema in the lower legs and feet.  Pedal  pulses are palpable bilaterally.  SKIN:  Warm and dry.  NEURO:  Shows to be alert and oriented x3.  Motor and sensory exams are  grossly normal.   LABORATORY DATA:  On examination reveals normal electrolytes with a BUN  of 12, creatinine 0.67.  Glucose is 145.  Coagulation profile is normal.  Hemoglobin 12.5, platelet count is 350,000, white blood cell count 9.1.  Her hemoglobin A1c was 6.6 on admission.   A CT scan of the chest showed a 5 cm filling defect in the left atrium  suspicious for left atrial myxoma.  There was no evidence of pulmonary  emboli.  There was a small amount of pericardial fluid.  The thoracic  aorta appeared normal and no arthrosclerotic changes.  There is small  bilateral pleural effusion in the bibasilar atelectasis.  There is a  patchy crown glass opacity suggesting partial airspace filling process,  possible edema, possible infectious or inflammatory alveolitis.  There  is also small pulmonary nodules, which were seen on previous CT scan  from 2003.  Electrocardiogram shows normal sinus  rhythm with possible  left atrial enlargement.   IMPRESSION:  Ms. Kettlewell has a large left atrial tumor that is mobile and  obstructing the left atrium and mitral valve.  She has a recent onset of  progressive symptoms.  She is  currently stable, walking the halls in the  hospital.  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  Surgical resection is the recommended  treatment.  She is scheduled to undergo cardiac catheterization on  Tuesday and we will make plans for surgery while she is here in the  hospital.  I discussed the CT scan and echocardiogram findings with the  patient and her daughter.  We discussed the surgical procedure,  alternatives, benefits, and risks including but not limited to bleeding,  blood transfusion, infection, stroke, myocardial infarction, recurrence  of the tumor, and death.  Also discussed the possibility of needing  coronary bypass surgery depending on the catheterization findings.  I  did discuss  the possibility that this tumor was invading the mitral valve itself  requiring partial resection of the valve and repair or possibly even  replacing the valve to allow complete surgical excision of the tumor.  They understand all of this and agree to proceed.      Evelene Croon, M.D.  Electronically Signed     BB/MEDQ  D:  12/27/2006  T:  12/28/2006  Job:  [id number]   cc:   Deirdre Peer. Polite, M.D.  Francisca December, M.D.

## 2010-09-11 NOTE — Op Note (Signed)
Brandi Griffin, Brandi Griffin             ACCOUNT NO.:  [id number]   MEDICAL RECORD NO.:  [id number]          PATIENT TYPE:  INP   LOCATION:  2309                         FACILITY:  [facility]   PHYSICIAN:  Evelene Croon, M.D.     DATE OF BIRTH:  06-Sep-1934   DATE OF PROCEDURE:  12/31/2006  DATE OF DISCHARGE:                               OPERATIVE REPORT   PREOPERATIVE AND POSTOPERATIVE DIAGNOSIS:  Large left atrial tumor.   OPERATIVE PROCEDURE:  Median sternotomy, extracorporeal circulation,  resection of large left atrial tumor with reconstruction of the atrial  septum and posterior left atrial wall using a pericardial patch.   ATTENDING SURGEON:  Dr. Evelene Croon.   ASSISTANT:  Tressie Stalker, MD   ANESTHESIA:  General endotracheal.   CLINICAL HISTORY:  This patient is a 76 year old woman who presented  with progressive shortness of breath.  An echocardiogram showed a large  left atrial tumor, suggesting a myxoma that had a diameter of about 5  cm.  This was filling up the majority of the left atrium and impinging  on the mitral valve with atrial systole.  She underwent cardiac  catheterization which showed no significant coronary disease.  There is  normal left ventricular function.  There was pulmonary hypertension with  a gradient across the mitral valve measured at about 16 mmHg.  After  review of these studies and examination of the patient I felt that  surgical resection of this left atrial tumor was the best treatment.  I  discussed the operative procedure with the patient and her family  including alternatives, benefits, and risks including bleeding, blood  transfusion, infection, stroke, myocardial infarction, heart block  requiring permanent pacemaker, possible need for mitral valve repair or  replacement depending on the location of the tumor, and death.  They  understood all this and agreed to proceed.   OPERATIVE PROCEDURE:  The patient was taken off the room and placed  on  table in supine position.  After induction of general endotracheal  anesthesia, a Foley catheter was placed in bladder using sterile  technique.  Preoperative intravenous antibiotics were given.  Then the  chest, abdomen and both lower extremities were prepped and draped in  usual sterile manner.  A transesophageal echocardiogram was performed by  anesthesiology.  This clearly showed the large left atrial tumor,  suggesting a myxoma.  It was not possible to see exactly where it was  attached.  There is some suspicion that there may be a small stalk  attached to the anterior mitral leaflet or the interatrial septum, but  it was not clear.  There was good left ventricular function.  There is  mild mitral regurgitation.  PA pressure was markedly elevated with PAD  of about 40.   Then the chest was opened through a median sternotomy incision.  The  pericardium opened in the midline.  Examination of the heart showed good  ventricular contractility.  The ascending aorta had no palpable plaques  in it.  Then the patient was heparinized and when an adequate activated  clotting time was achieved, the distal ascending aorta  was cannulated  using a 20-French aortic cannula for arterial inflow.  The venous  outflow was achieved using bicaval venous cannulation with 24-French  metal tipped right angle cannula placed through a pursestring suture in  the superior vena cava and a 32-French plastic right-angle cannula  placed through a pursestring in the low right atrium.  Antegrade  cardioplegia and vent cannula was inserted in the aortic root.  The  superior and inferior vena cavae were encircled with elastic tapes.  The  patient was then placed on cardiopulmonary bypass and temperature  allowed to drift down to 34 degrees centigrade.  The aorta was then  crossclamped and 500 mL of cold blood antegrade cardioplegia was  administered in the aortic root with quick arrest the heart.  Topical   hypothermia with iced saline was used.  An insulating pad was placed in  the pericardium.  Temperature probe placed in the septum.  Additional  doses of antegrade cardioplegia were given at about 30-minute intervals  to maintain myocardial temperature around 10 degrees centigrade.   Then the superior and inferior vena caval tapes were snugged.  The right  atrium was opened through an oblique incision beginning at the right  atrial appendage and continued down along the lateral wall of the right  atrium.  The fossa ovalis was identified and the septum was opened  through this area.  The left atrial tumor did not appear to be attached  to this area.  The interatrial septum was opened inferiorly.  It was  possible to examine the mitral valve and the tumor did not appear to  attach to the mitral valve at all.  The septum was then opened more  cephalad and it was apparent that the tumor was actually attached to the  upper portion the septum as well as down onto the posterior wall of the  left atrium.  In order to allow complete resection of the base of the  tumor where it attached to the atrial wall, it was necessary to extend  the septal incision down across the dome of left atrium.  This provided  good exposure and a section of the septum and posterior wall of the left  atrium were resected to allow complete excision of the site of  attachment of the left atrial tumor.  The tumor and this section of  atrial wall were sent to the pathologist separately.  After this  resection it left a large defect in the posterior wall of the left  atrium as well as the upper septum.  Then a patch of the patient's  pericardium was prepared and fixed in Cidex solution for about 2  minutes.  It was rinsed according to protocol.  It was cut to the  appropriate fashion.  This patch of pericardium was then used to  reconstruct the interatrial septum as well as the posterior wall of the  left atrium.  It was  sutured to these areas with continuous 3-0 Prolene  suture in two layers.  After reconstruction of these areas, the  atriotomy incision was closed using continuous 3-0 Prolene suture.  The  atriotomy incision was coated with Coseal for hemostasis.  The patient  rewarmed to 37 degrees centigrade.  After de-airing of the left side of  the heart.  The head was placed in Trendelenburg position and the  crossclamp was removed with time of 134 minutes.  There was spontaneous  return of a slow junctional rhythm.  Two temporary right ventricular  and  right atrial pacing wires placed and brought out through the skin.  When  the patient had rewarmed to 37 degrees centigrade, she was weaned from  cardiopulmonary bypass on no inotropic agents.  Total bypass time was  150 minutes.  Transesophageal echocardiogram showed normal left  ventricular function.  There was no mitral regurgitation.  There is  trace aortic insufficiency.  The septal patch was easily identified.  There was no evidence of leak across the septum.  PA pressures were  within normal limits.  Then protamine was given and the venous and  aortic cannulas removed without difficulty.  Hemostasis was achieved  without difficulty.  Two chest tubes were placed with a tube in the post  pericardium and one  in the anterior mediastinum.  The sternum was then  closed with #6 stainless steel wires.  The fascia was closed with  continuous #1 Vicryl suture.  Subcu tissue was closed with continuous 2-  0 Vicryl and skin with 3-0 Vicryl subcuticular closure.  The sponge,  needle and instrument counts were correct according to scrub nurse.  Dry  sterile dressing applied over the incisions and around the chest tubes  which were hooked to Pleur-Evac suction.  The patient remained  hemodynamically stable and was transported to the SICU in guarded but  stable condition.      Evelene Croon, M.D.  Electronically Signed     BB/MEDQ  D:  12/31/2006  T:   01/01/2007  Job:  [id number]   cc:   Francisca December, M.D.

## 2010-09-14 NOTE — Consult Note (Signed)
NAME:  Brandi Griffin, Brandi Griffin NO.:  [id number]   MEDICAL RECORD NO.:  [id number]          PATIENT TYPE:  OUT   LOCATION:  GYN                          FACILITY:  [facility]   PHYSICIAN:  De Blanch, M.D.DATE OF BIRTH:  [birth year]   DATE OF CONSULTATION:  12/04/2004  DATE OF DISCHARGE:                                   CONSULTATION   This 76 year old African-American female returns continuing follow-up of  stage IVb endometrial cancer.   INTERVAL HISTORY:  Since her last visit, the patient has done well. She has  no GI or GU symptoms; has no pelvic pain, pressure, vaginal bleeding, or  discharge. She had a colonoscopy recently which negative. She has also had a  bone density study but the results are not known. She had mammograms  recently also that were negative.   HISTORY OF PRESENT ILLNESS:  The patient underwent initial surgery at [facility] February 1998. She was found to have pelvic and periaortic lymph  node metastasis and was treated with pelvic and periaortic radiation therapy  as well as Megace. She also had pulmonary metastasis treated with Megace  which responded completely on follow-up chest x-rays.   MEDICAL ILLNESSES:  Diabetes, hypertension, hyperlipidemia, obesity.   DRUG ALLERGIES:  None.   FAMILY HISTORY:  Negative for gynecologic, breast, or colon cancer.   CURRENT MEDICATIONS:  Actos, Norvasc, insulin, metformin,  hydrochlorothiazide, lisinopril, Crestor, and Megace.   SOCIAL HISTORY:  The patient lives with her daughter, does not smoke, is  retired.   REVIEW OF SYSTEMS:  A 10-point review of systems is performed and is  negative except as noted above.   PHYSICAL EXAMINATION:  VITAL SIGNS:  Weight 167 pounds (up 13 pounds from  last year), blood pressure 150/70.  GENERAL:  Patient is a pleasant, healthy African-American female in no acute  distress.  HEENT:  Negative.  NECK:  Supple without thyromegaly.  LYMPH:  There is no  supraclavicular or inguinal adenopathy.  ABDOMEN:  Soft, nontender. No mass, organomegaly, ascites or hernias are  noted.  BREASTS:  Without masses, discharge, or skin changes.  PELVIC:  EB/BUS, vagina, bladder, and urethra are normal. Cervix and are  uterus surgically absent. No lesions are noted. The cuff is well supported.  Bimanual and rectovaginal exam reveal no masses, induration or nodularity.   IMPRESSION:  Stage IVb endometrial cancer in 1998, no evidence recurrent  disease.   PLAN:  Will continue Megace 40 mg b.i.d. Pap smears are obtained today. She  will return to see me in 1 year. We will plan on discontinuing Megace after  10 years of use.      De Blanch, M.D.  Electronically Signed     DC/MEDQ  D:  12/04/2004  T:  12/05/2004  Job:  [id number]   cc:   Telford Nab, R.N.  [address]  [place], [place] [address]

## 2010-09-14 NOTE — Consult Note (Signed)
NAME:  Brandi Griffin, Brandi Griffin                    ACCOUNT NO.:  [id number]   MEDICAL RECORD NO.:  [id number]                   PATIENT TYPE:  OUT   LOCATION:  GYN                                  FACILITY:  [facility]   PHYSICIAN:  De Blanch, M.D.         DATE OF BIRTH:  1934/08/31   DATE OF CONSULTATION:  11/17/2002  DATE OF DISCHARGE:                                   CONSULTATION   REPORT TITLE:  GYNECOLOGICAL CONSULTATION/CLINIC NOTE   REFERRING PHYSICIAN:  Barbette Hair. Merril Abbe, M.D., [facility] at  [facility]   REASON FOR CONSULTATION:  This 76 year old African American female returns  for continuing followup of a stage IVB endometrial adenocarcinoma.   INTERVAL HISTORY:  Since her last visit, the patient has done well. She  denies any GI or GU symptoms. Has no pelvic pain, pressure, vaginal  bleeding, or discharge. Her functional status is excellent. She has  continued to take Megace 40 mg q.i.d.   The patient underwent initial surgery at [facility] in February 1998.  She had pulmonary and periaortic metastasis. The periaortic  nodes were  treated with radiation therapy and the patient was subsequently treated with  Megace. She had a complete response on chest x-ray and has been followed  since that time with no evidence of disease.   CURRENT MEDICATIONS:  Antihypertensives and oral agents for diabetes.   ALLERGIES:  None.   FAMILY/SOCIAL HISTORY:  Reviewed and unchanged. It is noted that the  patient's daughter recently finished nursing school and is studying for her  nursing boards.   REVIEW OF SYSTEMS:  Reveals no cardiovascular, pulmonary, gastrointestinal,  genitourinary, musculoskeletal, or neurologic symptoms. She does have  diabetes, which is managed with oral agents.   PHYSICAL EXAMINATION:  VITAL SIGNS:  Weight 156 pounds, blood pressure  160/88.  GENERAL:  The patient is a healthy African American female in no acute  distress.  HEENT:  Negative.  NECK:  Supple without thyromegaly. There is no supraclavicular, axillary, or  inguinal adenopathy.  ABDOMEN:  Soft, nontender. No masses, organomegaly, ascites, or hernias are  noted.  PELVIC:  EG, BUS, vagina, bladder and urethra are normal. Vagina is slightly  foreshortened. Cervix and uterus surgically absent. Adnexa without masses.  Rectovaginal examination confirms. Measurable disease none.   IMPRESSION:  Stage IVB endometrial cancer 1998. No evidence of recurrent  disease.    PLAN:  Pap smears are obtained at this juncture. Given more than five years  of followup, we will diminish the patient's Megace dose to 40 mg b.i.d. She  will return to see me in one year for continuing followup.                                               De Blanch, M.D.    DC/MEDQ  D:  11/17/2002  T:  11/17/2002  Job:  [id number]   cc:   Barbette Hair., M.D., [facility] at [facility], R.N.  [address]  [place], [place] [address]

## 2010-09-14 NOTE — Consult Note (Signed)
NAME:  Brandi Griffin, Brandi Griffin                    ACCOUNT NO.:  [id number]   MEDICAL RECORD NO.:  [id number]                   PATIENT TYPE:  OUT   LOCATION:  GYN                                  FACILITY:  [facility]   PHYSICIAN:  Daniel L. Clarke-Pearson, M.D.      DATE OF BIRTH:  31-Oct-1934   DATE OF CONSULTATION:  12/01/2001  DATE OF DISCHARGE:  12/01/2001                                 GYN CONSULTATION   HISTORY OF PRESENT ILLNESS:  This 76 year old African-American female  returns for continuing follow-up of a stage IVB endometrial adenocarcinoma.  She underwent initial surgery at [facility] February 1998.  She had  pulmonary and periaortic metastases, treated with pelvic radiation therapy,  periaortic radiation therapy, and Megace.  She has apparently had a complete  response and has been followed since that time with no evidence of  recurrent disease.  The patient has had periodic CT scans.  She had one on  October 06, 2001, which shows multiple small peripheral pulmonary nodules in  both lungs which are probably stable in size, given the change in technique  from between the prior scans.  It is thought that these are most likely  postinflammatory in nature.  The remainder of the CT scan of the abdomen and  pelvis shows no evidence of recurrent disease.  The patient herself reports  she feels well.  She has no GI or GU symptoms.  Has no pelvic pain,  pressure, vaginal bleed, or discharge.   CURRENT MEDICATIONS:  Antihypertensive.  Oral agents for diabetes.   FAMILY HISTORY, SOCIAL HISTORY:  Reviewed and unchanged.   REVIEW OF SYSTEMS:  Essentially negative.   PHYSICAL EXAMINATION:  VITAL SIGNS:  Weight 160 pounds (stable).  Blood  pressure 158/102.  GENERAL:  Healthy black female in no acute distress.  HEENT:  Negative.  NECK:  Supple.  Without thyromegaly.  There is no supraclavicular or  inguinal adenopathy.  ABDOMEN:  Soft and nontender.  No masses, organomegaly,  ascites, or hernias  are noted.  PELVIC:  EGBUS, vagina, bladder, urethra are normal.  Cervix and uterus are  surgically absent.  Adnexa without masses.  RECTOVAGINAL:  Confirms.   IMPRESSION:  Stage IVB endometrial cancer in 1998.  No evidence of recurrent  disease.  The patient is currently taking Megace.    PLAN:  Pap smears were obtained today.  The patient to return in one year  for continuing follow-ups.  She will continue to take Megace 40 mg q.i.d.  She was given a renewed prescription.                                                 Daniel L. Stanford Breed, M.D.    DLC/MEDQ  D:  12/01/2001  T:  12/06/2001  Job:  [id number]   cc:   Telford Nab, R.N.  Blenda Peals, M.D.  Division of Gynecologic Oncology  [facility]  [address] [id number]  [place], [place] [address]

## 2010-09-14 NOTE — Op Note (Signed)
NAME:  CALVINA, LIPTAK NO.:  [id number]   MEDICAL RECORD NO.:  [id number]          PATIENT TYPE:  AMB   LOCATION:  ENDO                         FACILITY:  [facility]   PHYSICIAN:  Danise Edge, M.D.   DATE OF BIRTH:  1935-01-13   DATE OF PROCEDURE:  10/22/2004  DATE OF DISCHARGE:                                 OPERATIVE REPORT   PROCEDURE:  Screening colonoscopy.   PROCEDURE INDICATIONS:  Ms. Robbi Spells is a 76 year old female born  1934/08/15. Ms. Kreiser is scheduled to undergo her first screening  colonoscopy with polypectomy to prevent colon cancer.   ENDOSCOPIST:  Danise Edge, M.D.   PREMEDICATION:  Versed 5 mg, Demerol 50 mg.   PROCEDURE:  After obtaining informed consent, Ms. Danley was placed in the  left lateral decubitus position. I administered intravenous Demerol and  intravenous Versed to achieve conscious sedation for the procedure. The  patient's blood pressure, oxygen saturation and cardiac rhythm were  monitored throughout the procedure and documented in the medical record.   Anal inspection and digital rectal exam were normal. The Olympus adjustable  pediatric colonoscope was introduced into the rectum and advanced to the  cecum. Colonic preparation for the exam today was satisfactory.   Rectum normal. Retroflex view of the distal rectum normal.  Sigmoid colon and descending colon normal.  Splenic flexure normal.  Transverse colon normal.  Hepatic flexure normal.  Ascending colon normal.  Cecum and ileocecal valve normal.   ASSESSMENT:  Normal screening proctocolonoscopy to cecum.       MJ/MEDQ  D:  10/22/2004  T:  10/22/2004  Job:  [id number]

## 2010-09-14 NOTE — H&P (Signed)
[facility]  Patient:    Brandi Griffin, Brandi Griffin                 MRN: [id number] Adm. Date:  [id number] Attending:  Jeannette Corpus CC:         Lum Babe, M.D.             Telford Nab, R.N.                         History and Physical  GYN-ONCOLOGY PROGRAM  HISTORY:  A 76 year old African-American female who had a stage IVB endometrial adenocarcinoma, initially undergoing surgery at [facility] in February 1998.  The patient was found to have pulmonary metastases, as well as para-aortic lymphadenopathy.  She received extended field radiation therapy and was placed on Megace.  She has had a complete response to Megace.  She is taking 40 mg of Megace four times a day.  She seems to tolerating this well.  Since her last visit, she has done well.  She denies any GI or GU symptoms. She has no pelvic pain, pressure, vaginal bleeding, or discharge.  She has no pulmonary symptoms.  REVIEW OF SYSTEMS:  The patient is diabetic on Glucophage.  This seems to be under good control according to the patient.  FAMILY HISTORY AND SOCIAL HISTORY:  Reviewed and unchanged from previous notation.  She comes accompanied by her daughter today.  PHYSICAL EXAMINATION:  GENERAL:  Weight 149 pounds.  The patient is a healthy black female in no acute distress.  VITAL SIGNS:  Blood pressure 130/80.  HEENT:  Negative.  NECK:  Supple without thyromegaly.  There is no supraclavicular, axillary or inguinal adenopathy.  CHEST:  Clear to percussion and auscultation.  ABDOMEN:  Soft, nontender.  No masses, organomegaly, ascites, or hernias noted. PELVIC EXAM:  EGBUS normal.  Vagina is clean, well supported.  No lesions are noted.  Bimanual and rectovaginal exam reveal no masses, induration, or nodularity.  LABORATORY DATA:  Her laboratory work is reviewed.  The patient had a CAT scan of the chest, abdomen, and pelvis on October 09, 1999,  all of which appears to be normal with no evidence of recurrent disease.  IMPRESSION:  Stage IVB endometrial carcinoma with complete response to Megace therapy.  The patient is given a new prescription for Megace 40 mg q.i.d.  She will return to see me again in six months and we will plan a CAT scan again in one year. DD:  10/10/99 TD:  10/10/99 Job: [id number]

## 2010-09-14 NOTE — Consult Note (Signed)
[facility]  Patient:    Brandi Griffin, Brandi Griffin                 MRN: [id number] Proc. Date: 04/09/00 Adm. Date:  [id number] Disc. Date: [id number] Attending:  Jeannette Corpus CC:         Telford Nab, R.N.   Consultation Report  HISTORY OF PRESENT ILLNESS:  The patient is a 76 year old African-American female.  She has stage IVB endometrial carcinoma initially undergoing surgery at [facility] in February 1998.  She has had pulmonary metastases as well as para-aortic metastases.  She received external beam radiation therapy and subsequently has been placed on Megace.  She has a complete response to Megace.  Since her last visit she has done well.  She denies any GI or GU symptoms, has no pelvic pain/pressure, vaginal bleeding, or discharge.  She has no pulmonary symptoms.  REVIEW OF SYSTEMS:  Essentially unchanged and negative.  FAMILY HISTORY AND SOCIAL HISTORY:  Again are reviewed and unchanged.  PHYSICAL EXAMINATION:  GENERAL APPEARANCE:  The patient is a healthy female in no acute distress.  VITAL SIGNS:  Weight 154 pounds, blood pressure 152/94.  HEENT:  Negative.  NECK:  Supple without thyromegaly.  LYMPHATICS:  There is no supraclavicular, axillary, or inguinal adenopathy.  ABDOMEN:  Soft and nontender.  No masses, organomegaly, ascites, or hernias are noted.  PELVIC:  EGBUS normal.  Vagina is clean, well supported, and no masses, organomegaly, or ascites are noted.  Rectovaginal exam confirms.  IMPRESSION:  Stage IVB endometrial carcinoma with lung metastases.  The patient remains free of disease.  PLAN:  She will continue taking Megace q.i.d. (40 mg).  She will return to see me in six months for continuing follow-up. DD:  04/15/00 TD:  04/16/00 Job: [id number]

## 2010-09-14 NOTE — Consult Note (Signed)
NAME:  Brandi Griffin, Brandi Griffin NO.:  [id number]   MEDICAL RECORD NO.:  [id number]          PATIENT TYPE:  OUT   LOCATION:  GYN                          FACILITY[facility]  [facility]   PHYSICIAN:  De Blanch, M.D.DATE OF BIRTH:  10/12/1934   DATE OF CONSULTATION:  DATE OF DISCHARGE:                                 CONSULTATION   CHIEF COMPLAINT:  Endometrial cancer.   INTERVAL HISTORY:  Since her last visit, the patient has done well.  She  has been on a weight reduction diet and has had moderately good success  with it.  She had mammograms recently that were negative as well as a  colonoscopy from the last year and a bone density study.  From a  gynecologic point of view, the patient denies any pelvic pain, pressure,  vaginal bleeding or discharge.  Functional status is excellent.  She has  no GI or GU symptoms.   HISTORY OF PRESENT ILLNESS:  Patient underwent initial surgery at [facility] in February, 1998.  She was found to have pelvic and  periaortic lymph node metastasis and was treated with pelvic and  periaortic radiation therapy.  She was also treated with Megace.  She  had pulmonary metastasis treated with Megace, which responded completely  on follow-up chest x-rays.  She has been followed up since that time  with no evidence of recurrent disease.   PAST MEDICAL HISTORY:  Diabetes, hypertension, hyperlipidemia.   DRUG ALLERGIES:  None.   FAMILY HISTORY:  Negative for gynecologic, breast, or colon cancer.   CURRENT MEDICATIONS:  Actos, Norvasc, insulin, Metformin,  hydrochlorothiazide, lisinopril, Crestor, Megace.   The patient lives with her daughter.  She does not smoke.  She is  retired.   REVIEW OF SYSTEMS:  A 10-point comprehensive review of systems is  negative, except as noted above.   PHYSICAL EXAMINATION:  VITAL SIGNS:  Weight 154 pounds (down from 167  pounds a year and a half ago).  Blood pressure 130/70, pulse 80,  respiratory rate  20.  GENERAL:  Patient is a healthy African-American female in no acute  distress.  HEENT:  Negative.  NECK:  Supple without thyromegaly.  There is no supraclavicular or  inguinal adenopathy.  ABDOMEN:  Soft and nontender.  No masses, organomegaly, ascites, or  hernias are noted.  PELVIC:  EG/BUS, vagina, bladder, and urethra are normal.  The cervix  and uterus are surgically absent.  Adnexa are without masses.  Rectovaginal exam confirms.  EXTREMITIES:  Lower extremities are without edema and varicosities.   IMPRESSION:  Stage IVB endometrial carcinoma, 1998.  Patient is  clinically free of disease.  At this juncture, I think it is reasonable  to stop her Megace.  She will return to see me in one year for continued  followup.  Pap smears are obtained today.      De Blanch, M.D.  Electronically Signed     DC/MEDQ  D:  05/15/2006  T:  05/15/2006  Job:  [id number]   cc:   Telford Nab, R.N.  [address]  [place], [place] [address]  Ladell Pier, M.D.  Fax: [phone]

## 2010-09-14 NOTE — Consult Note (Signed)
[facility]  Patient:    Brandi Griffin, Brandi Griffin                 MRN: [id number] Proc. Date: 10/12/00 Adm. Date:  [id number] Attending:  Jeannette Corpus CC:         Bertram Millard. Dahlstedt, M.D.  Telford Nab, R.N.   Consultation Report  HISTORY OF PRESENT ILLNESS:  A 76 year old African-American female who returns for continuing follow-up of a stage IVB endometrial cancer.  She underwent initial surgery in February 1998 at [facility].  She was found to have pulmonary and periaortic metastases.  She received pelvic and periaortic radiation therapy followed by Megace and has had a complete response.  Since her last visit, she has done well.  She denies any GI or GU symptoms.  She has no pelvic pain, pressure, vaginal bleeding, or discharge.  REVIEW OF SYSTEMS:  Essentially negative.  She has no flank pain.  FAMILY HISTORY/SOCIAL HISTORY:  Reviewed and unchanged.  PHYSICAL EXAMINATION:  VITAL SIGNS:  Weight 157 pounds, blood pressure 150/90.  GENERAL:  The patient is a healthy black female in no acute distress.  HEENT:  Negative.  NECK:  Supple without thyromegaly.  LYMPH NODES:  There is no supraclavicular or inguinal adenopathy.  ABDOMEN:  Soft and nontender.  No masses, organomegaly, ascites, or hernias are noted.  PELVIC:  EGBUS normal.  The vagina is clean and well supported but atrophic. Bimanual and rectovaginal exam revealed no masses, induration, or nodularity. Pap smears are obtained.  EXTREMITIES:  There is no CVA tenderness.  Lower extremities without edema or varicosities.  LABORATORY DATA:  Laboratory work is reviewed.  The patient had a CAT scan of the chest, abdomen, and pelvis that shows no evidence of metastatic disease; however, on the most recent study there is a 1.8 cm diameter well-circumscribed hypodense lesion associated with the mid portion of the left kidney.  IMPRESSION: 1. Stage IVB endometrial  cancer on Megace with complete response. 2. New finding in the left kidney on CT scan.  PLAN:  Pap smears are obtained.  The patient will return to see me in six months for a follow-up, which will be five-year follow-up.  We will obtain a thin-cut CT scan of the left kidney and refer the patient to Dr. Patsi Sears for a consultation regarding the renal lesion. DD:  10/14/00 TD:  10/14/00 Job: [id number]

## 2010-09-14 NOTE — Consult Note (Signed)
NAME:  Brandi Griffin, Brandi Griffin                    ACCOUNT NO.:  [id number]   MEDICAL RECORD NO.:  [id number]                   PATIENT TYPE:  OUT   LOCATION:  GYN                                  FACILITY:  [facility]   PHYSICIAN:  De Blanch, M.D.         DATE OF BIRTH:  August 23, 1934   DATE OF CONSULTATION:  11/23/2003  DATE OF DISCHARGE:                                   CONSULTATION   REASON FOR CONSULTATION:  A 76 year old African-American female returns for  continuing followup of a stage IVB endometrial cancer initially diagnosed in  1998.   INTERVAL HISTORY:  Since her last visit, the patient has done well.  She  denies any GI or GU symptoms, has no pelvic pain, pressure, vaginal  bleeding, or discharge.  She is now taking Megace 40 mg b.i.d. and  tolerating it well.  Functional status is excellent.  She recently returned  from a family reunion in [place].   HISTORY OF PRESENT ILLNESS:  The patient underwent initial surgery at [facility] in February 1998.  She was found to have pelvic and periaortic  metastases and was treated with pelvic and periaortic radiation therapy and  Megace.  She had complete response based on chest x-ray, and has been  followed since that time.   CURRENT MEDICATIONS:  1. Antihypertensive.  2. Oral diabetic agents.   ALLERGIES:  No known drug allergies.   FAMILY HISTORY:  Reviewed and noncontributory.   SOCIAL HISTORY:  Reviewed and is negative except as noted above.   PHYSICAL EXAMINATION:  VITAL SIGNS:  Weight 154 pounds, blood pressure  154/86.  GENERAL:  The patient is a healthy black female in no acute distress.  HEENT:  Negative.  NECK:  Supple without thyromegaly.  There is no supraclavicular, axillary,  or inguinal adenopathy.  ABDOMEN:  Soft and nontender.  No masses, organomegaly, ascites, or hernias  are noted.  PELVIC:  EGBUS, vagina, bladder, and urethra are normal.  Cervix and uterus  are surgically absent.  Adnexa  without masses.  Rectovaginal examination  confirms.   IMPRESSION:  Stage IVB endometrial cancer, 1998, no evidence of recurrent  disease.   PLAN:  Pap smears are obtained.  The patient will continue taking Megace 40  mg b.i.d.  She is given a new prescription.  She will return to see [place] in  one year for continuing surveillance.                                               De Blanch, M.D.    DC/MEDQ  D:  11/23/2003  T:  11/23/2003  Job:  [id number]   cc:   Telford Nab, R.N.  [address]  [place], [place] [address]

## 2010-09-14 NOTE — Consult Note (Signed)
[facility]  Patient:    Brandi Griffin, Brandi Griffin Visit Number: [id number] MRN: [id number]          Service Type: GON Location: GYN Attending Physician:  Jeannette Corpus Dictated by:   Rande Brunt. Clarke-Pearson, M.D. Proc. Date: 04/14/01 Admit Date:  04/14/2001   CC:         Bertram Millard. Dahlstedt, M.D.  Telford Nab, R.N.  Blenda Peals, M.D.; [facility]; Dept. of OB/GYN [address]             [phone] [place], [place] [address]   Consultation Report  A 76 year old African-American female returns for continuing follow-up.  She had a stage IVB endometrial cancer initially undergoing surgery at [facility] in February 1998.  She had pulmonary and periaortic metastasis. She was treated with pelvic and periaortic radiation therapy followed by Megace and had a complete response.  She has been followed since that time with no evidence of recurrent disease.  Patient has had periodic CAT scans, the last being performed in June which was negative.  The patient has also been evaluated by Dr. Marcine Matar recently regarding a simple cyst in her left kidney which is asymptomatic.  No further therapy was recommended.  REVIEW OF SYSTEMS:  The patient has been healthy.  She has no GI, GU, cardiovascular, pulmonary, or neurologic symptoms.  FAMILY HISTORY:  Reviewed and unchanged.  SOCIAL HISTORY:  Reviewed and unchanged.  PHYSICAL EXAMINATION  VITAL SIGNS:  Weight 162 pounds, blood pressure 162/90.  GENERAL:  Patient is a healthy African-American female in no acute distress.  HEENT:  Negative.  NECK:  Supple without thyromegaly.  There is no supraclavicular or inguinal adenopathy.  ABDOMEN:  Soft and nontender.  No masses, organomegaly, ascites, or hernias noted.  PELVIC:  EGBUS:  Normal.  Vagina is atrophic, but clean.  Bimanual and rectovaginal examinations reveal no masses, induration, or  nodularity.  IMPRESSION:  Stage IVB endometrial cancer status post radiation therapy and Megace.  No evidence of recurrent disease now with nearly five years of follow-up.  PLAN:  Pap smears are obtained.  The patient will return to see [place] in six months.  Thereafter we will plan annual examination. Dictated by:   Rande Brunt. Clarke-Pearson, M.D. Attending Physician:  Jeannette Corpus DD:  04/14/01 TD:  04/14/01 Job: [id number]

## 2010-09-14 NOTE — Discharge Summary (Signed)
Brandi Griffin, Brandi Griffin             ACCOUNT NO.:  [id number]   MEDICAL RECORD NO.:  [id number]          PATIENT TYPE:  INP   LOCATION:  2002                         FACILITY:  [facility]   PHYSICIAN:  Evelene Croon, M.D.     DATE OF BIRTH:  19-Aug-1934   DATE OF ADMISSION:  12/24/2006  DATE OF DISCHARGE:  01/08/2007                               DISCHARGE SUMMARY   HISTORY OF PRESENT ILLNESS:  This patient is a 76 year old white female  with past medical history of hypertension, diabetes mellitus, and  hyperlipidemia who has been feeling well with no complaints for the last  several days.  She denied fevers, chills, cough, shortness of breath,  lower extremity swelling and seemed to be otherwise doing well until the  date of admission when she noted that she had become short of breath.  She had no chest pain and again no other symptoms, but appeared to be  with any kind of exertion she started feeling short of breath.  It got  to the point where she felt extremely winded doing even small activities  and came to the Emergency Department for further evaluation.  In the  emergency room, while walking across the ER to her room, she felt even  short of breath then.  She was noted to be having normal oxygen  saturations in the range of 98-100% on 2 liters to even room air.  Labs  were ordered and the patient was found to have normal electrolytes and  renal function, normal white cell count with no shift.  Her hemoglobin  was unremarkable.  Her BNP was only mildly elevated at 234 and D-dimer  was slightly elevated at 0.98.  Cardiac markers were unremarkable.  An  EKG was unremarkable.  Chest x-ray showed a nonspecific bibasilar  infiltrate consistent with possible atypical pneumonia.  Because of the  elevation in the D-dimer and sudden onset of shortness of breath, Dr.  Oletta Lamas, the ER physician, was concerned about possible pulmonary embolism  and she was sent for CT angio of the chest which  preliminary studies  showed no pulmonary embolism, but showed a possible mass at the right  atrium concerning for possible tumor such as myxoma.  She was then felt  to require admission for further evaluation and treatment.   Review of symptoms otherwise negative.   Please see the history and physical for further details.   PAST MEDICAL HISTORY:  1. Diabetes mellitus.  2. Hypertension.  3. Hyperlipidemia.  4. History of endometrial cancer with metastasis to the lung status      post full treatment and has been cancer-free x10 years.   ALLERGIES:  No known drug allergies.   MEDICATIONS PRIOR TO ADMISSION:  1. Actos 30 mg daily.  2. Crestor 20 mg daily.  3. Hydrochlorothiazide 25 mg daily.  4. Lisinopril 40 mg daily.  5. Metformin 2000 mg daily.  6. Multivitamin one daily.   For family history, social history and physical exam, please see the  history and physical done at the time of admission.   HOSPITAL COURSE:  The patient was admitted as  stated.  A Cardiology  consultation was obtained with [facility].  It was their opinion  that she needed cardiac catheterization to define her coronary anatomy  before resection of this atrial mass.  A Surgical consultation was also  obtained by Dr. Evelene Croon.  Dr. Laneta Simmers evaluated the studies and  recommended surgical excision of the lesion.  The catheterization was  done on 12/30/2006.  It showed no significant coronary artery disease.  Further discussion was undertaken with the patient regarding surgery and  she agreed with recommendations to proceed.  On December 31, 2006, the  following procedure was performed.   PROCEDURE:  Resection of large left atrial tumor, reconstruction of  septum and posterior wall of the left atrium with pericardial patch.  The patient tolerated the procedure well and was taken the Surgical  Intensive Care Unit in stable condition.   POSTOPERATIVE HOSPITAL COURSE:  The patient has overall done  quite well.  She has maintained stable hemodynamics.  She did have some atrial pacing  requirement initially and some brady dysrhythmias through the  hospitalization.  This gradually improved.  She did not require further  measures in this regard.  Her diabetes was under adequate control  postoperatively.  She tolerated gradual increasing activities using  standard protocols.  The patient's incision was healing well without  evidence of infection.  All routine lines, monitors, drainage devices  were discontinued using standard protocols.  She did develop some mild  bronchospasm and evidence of bronchitis and was treated aggressively  with nebulizers and pulmonary toilet.  Her overall status was deemed to  be acceptable for discharge on January 07, 2007.   INSTRUCTIONS:  The patient received written instructions regarding  medications, activity, diet, wound care and followup.   DISCHARGE FOLLOWUP:  Follow up with Dr. Laneta Simmers 3 weeks post-discharge.  Follow up with Dr. Amil Amen 2 weeks post-discharge.   DISCHARGE MEDICATIONS:  1. Aspirin 325 mg daily.  2. Altace 2.5 mg daily.  3. Lipitor 40 mg daily.  4. Actos 15 mg daily.  5. Glucophage 1000 mg twice daily.  6. Multivitamin one daily.  7. Ultram 50 mg one or two every 6 hours as needed.   FINAL DIAGNOSIS:  Left atrial tumor.  The pathology on this was benign;  please see the report for further details.   OTHER DIAGNOSES:  1. Mild postoperative pleural effusions.  2. Postoperative bronchitis.  3. Diabetes mellitus type 2.  4. Hypertension.  5. Hyperlipidemia.  6. Obesity.  7. Previous history of uterine malignancy with lung metastasis.  8. Postoperative brady dysrhythmias.      Rowe Clack, P.A.-C.      Evelene Croon, M.D.  Electronically Signed    WEG/MEDQ  D:  03/05/2007  T:  03/05/2007  Job:  [id number]   cc:   Evelene Croon, M.D.  Deirdre Peer. Polite, M.D.

## 2011-02-08 LAB — BASIC METABOLIC PANEL
BUN: 10
BUN: 11
BUN: 11
BUN: 12
CO2: 28
Calcium: 8.6
Calcium: 9
Chloride: 101
Chloride: 109
Chloride: 111
Creatinine, Ser: 0.62
Creatinine, Ser: 0.69
Creatinine, Ser: 0.73
GFR calc Af Amer: >60
GFR calc Af Amer: >60
GFR calc Af Amer: >60
GFR calc non Af Amer: >60
GFR calc non Af Amer: >60
GFR calc non Af Amer: >60
Glucose, Bld: 110 — ABNORMAL HIGH
Glucose, Bld: 163 — ABNORMAL HIGH
Glucose, Bld: 145 — ABNORMAL HIGH
Potassium: 3.5
Potassium: 4.2
Potassium: 3.7
Sodium: 141
Sodium: 139

## 2011-02-08 LAB — CBC
HCT: 29.9 — ABNORMAL LOW
HCT: 36
HCT: 37.3
Hemoglobin: 12.5
MCHC: 33.6
MCHC: 33.6
MCHC: 33.5
MCV: 81
MCV: 82.2
MCV: 82.9
MCV: 83.4
MCV: 83.7
MCV: 80.5
Platelets: 108 — ABNORMAL LOW
Platelets: 134 — ABNORMAL LOW
Platelets: 140 — ABNORMAL LOW
Platelets: 150
Platelets: 314
Platelets: 350
RBC: 3.9
RBC: 4.45
RBC: 4.63
RDW: 16.5 — ABNORMAL HIGH
RDW: 16.6 — ABNORMAL HIGH
RDW: 17 — ABNORMAL HIGH
RDW: 16.1 — ABNORMAL HIGH
WBC: 10.9 — ABNORMAL HIGH
WBC: 11.9 — ABNORMAL HIGH
WBC: 12.1 — ABNORMAL HIGH
WBC: 6.6
WBC: 9.1

## 2011-02-08 LAB — BASIC METABOLIC PANEL WITH GFR
BUN: 12
CO2: 29
Chloride: 104
Creatinine, Ser: 0.67

## 2011-02-08 LAB — URINALYSIS, ROUTINE W REFLEX MICROSCOPIC
Bilirubin Urine: NEGATIVE
Hgb urine dipstick: NEGATIVE
Nitrite: NEGATIVE
Specific Gravity, Urine: 1.013
Urobilinogen, UA: 1
pH: 8

## 2011-02-08 LAB — POCT I-STAT 3, ART BLOOD GAS (G3+)
Acid-Base Excess: 1
Acid-base deficit: 3 — ABNORMAL HIGH
Acid-base deficit: 3 — ABNORMAL HIGH
Bicarbonate: 25 — ABNORMAL HIGH
Bicarbonate: 25.3 — ABNORMAL HIGH
O2 Saturation: 99
Operator id: 256891
Operator id: 3291
Operator id: 3291
Patient temperature: 35.9
TCO2: 27
pCO2 arterial: 36.8
pCO2 arterial: 42.7
pH, Arterial: 7.406 — ABNORMAL HIGH
pH, Arterial: 7.439 — ABNORMAL HIGH
pH, Arterial: 7.465 — ABNORMAL HIGH
pO2, Arterial: 279 — ABNORMAL HIGH
pO2, Arterial: 326 — ABNORMAL HIGH
pO2, Arterial: 64 — ABNORMAL LOW

## 2011-02-08 LAB — POCT I-STAT 4, (NA,K, GLUC, HGB,HCT)
Glucose, Bld: 105 — ABNORMAL HIGH
Glucose, Bld: 115 — ABNORMAL HIGH
Glucose, Bld: 116 — ABNORMAL HIGH
Glucose, Bld: 124 — ABNORMAL HIGH
Glucose, Bld: 81
HCT: 22 — ABNORMAL LOW
HCT: 23 — ABNORMAL LOW
HCT: 23 — ABNORMAL LOW
HCT: 30 — ABNORMAL LOW
Hemoglobin: 10.2 — ABNORMAL LOW
Hemoglobin: 7.1 — CL
Hemoglobin: 7.8 — CL
Hemoglobin: 7.8 — CL
Operator id: 3291
Operator id: 3291
Operator id: 3291
Potassium: 3.6
Potassium: 3.6
Potassium: 4.1
Potassium: 4.6
Potassium: 4.6
Potassium: 4.6
Sodium: 137
Sodium: 138

## 2011-02-08 LAB — HEPATIC FUNCTION PANEL
ALT: 14
Alkaline Phosphatase: 60
Indirect Bilirubin: 0.5
Total Bilirubin: 0.6

## 2011-02-08 LAB — POCT I-STAT 3, VENOUS BLOOD GAS (G3P V)
Bicarbonate: 30.4 — ABNORMAL HIGH
O2 Saturation: 82
Operator id: 208821
TCO2: 23
TCO2: 33
pCO2, Ven: 36.8 — ABNORMAL LOW
pCO2, Ven: 47.2
pCO2, Ven: 47.7
pH, Ven: 7.417 — ABNORMAL HIGH
pH, Ven: 7.43 — ABNORMAL HIGH
pO2, Ven: 34
pO2, Ven: 46 — ABNORMAL HIGH

## 2011-02-08 LAB — PLATELET COUNT: Platelets: 114 — ABNORMAL LOW

## 2011-02-08 LAB — COMPREHENSIVE METABOLIC PANEL
Albumin: 2.8 — ABNORMAL LOW
Alkaline Phosphatase: 65
BUN: 11
GFR calc Af Amer: >60
Potassium: 3.8
Sodium: 142
Total Protein: 6.5

## 2011-02-08 LAB — DIFFERENTIAL
Basophils Absolute: 0.1
Basophils Relative: 1
Eosinophils Absolute: 0.1
Eosinophils Relative: 1
Lymphocytes Relative: 24
Lymphs Abs: 2.2
Monocytes Absolute: 0.6
Monocytes Relative: 6
Neutro Abs: 6.2
Neutrophils Relative %: 68

## 2011-02-08 LAB — HEMOGLOBIN A1C
Hgb A1c MFr Bld: 6.6 — ABNORMAL HIGH
Mean Plasma Glucose: 158

## 2011-02-08 LAB — POCT CARDIAC MARKERS
CKMB, poc: 1.5
Myoglobin, poc: 92.6
Operator id: 4295
Troponin i, poc: <0.05

## 2011-02-08 LAB — BLOOD GAS, ARTERIAL
Bicarbonate: 26.8 — ABNORMAL HIGH
O2 Content: 21
O2 Saturation: 96
pO2, Arterial: 78.9 — ABNORMAL LOW

## 2011-02-08 LAB — APTT: aPTT: 31

## 2011-02-08 LAB — TYPE AND SCREEN
ABO/RH(D): B POS
Antibody Screen: NEGATIVE

## 2011-02-08 LAB — PROTIME-INR
INR: 1.1
Prothrombin Time: 19.6 — ABNORMAL HIGH
Prothrombin Time: 13.9

## 2011-02-08 LAB — MAGNESIUM
Magnesium: 3 — ABNORMAL HIGH
Magnesium: 3.3 — ABNORMAL HIGH

## 2011-02-08 LAB — URINE CULTURE

## 2011-02-08 LAB — LIPID PANEL
Cholesterol: 129
HDL: 42
LDL Cholesterol: 69
Triglycerides: 92

## 2011-02-08 LAB — I-STAT EC8
Bicarbonate: 26.4 — ABNORMAL HIGH
HCT: 32 — ABNORMAL LOW
Potassium: 4
TCO2: 28
pH, Arterial: 7.337 — ABNORMAL LOW

## 2011-02-08 LAB — CREATININE, SERUM: GFR calc Af Amer: >60

## 2011-02-08 LAB — HEMOGLOBIN AND HEMATOCRIT, BLOOD: HCT: 21.4 — ABNORMAL LOW

## 2011-02-08 LAB — B-NATRIURETIC PEPTIDE (CONVERTED LAB): Pro B Natriuretic peptide (BNP): 234 — ABNORMAL HIGH

## 2011-02-08 LAB — D-DIMER, QUANTITATIVE: D-Dimer, Quant: 0.98 — ABNORMAL HIGH

## 2011-02-08 LAB — ABO/RH: ABO/RH(D): B POS

## 2011-06-21 DIAGNOSIS — H43399 Other vitreous opacities, unspecified eye: Secondary | ICD-10-CM | POA: Diagnosis not present

## 2011-06-21 DIAGNOSIS — E109 Type 1 diabetes mellitus without complications: Secondary | ICD-10-CM | POA: Diagnosis not present

## 2011-06-21 DIAGNOSIS — H2510 Age-related nuclear cataract, unspecified eye: Secondary | ICD-10-CM | POA: Diagnosis not present

## 2011-06-21 DIAGNOSIS — H02839 Dermatochalasis of unspecified eye, unspecified eyelid: Secondary | ICD-10-CM | POA: Diagnosis not present

## 2011-06-26 DIAGNOSIS — S52599A Other fractures of lower end of unspecified radius, initial encounter for closed fracture: Secondary | ICD-10-CM | POA: Diagnosis not present

## 2011-06-26 DIAGNOSIS — M25539 Pain in unspecified wrist: Secondary | ICD-10-CM | POA: Diagnosis not present

## 2011-07-01 DIAGNOSIS — S52599A Other fractures of lower end of unspecified radius, initial encounter for closed fracture: Secondary | ICD-10-CM | POA: Diagnosis not present

## 2011-07-02 DIAGNOSIS — W19XXXA Unspecified fall, initial encounter: Secondary | ICD-10-CM | POA: Diagnosis not present

## 2011-07-02 DIAGNOSIS — S52599A Other fractures of lower end of unspecified radius, initial encounter for closed fracture: Secondary | ICD-10-CM | POA: Diagnosis not present

## 2011-07-08 DIAGNOSIS — S52599A Other fractures of lower end of unspecified radius, initial encounter for closed fracture: Secondary | ICD-10-CM | POA: Diagnosis not present

## 2011-07-10 DIAGNOSIS — E1129 Type 2 diabetes mellitus with other diabetic kidney complication: Secondary | ICD-10-CM | POA: Diagnosis not present

## 2011-07-10 DIAGNOSIS — N182 Chronic kidney disease, stage 2 (mild): Secondary | ICD-10-CM | POA: Diagnosis not present

## 2011-07-10 DIAGNOSIS — E785 Hyperlipidemia, unspecified: Secondary | ICD-10-CM | POA: Diagnosis not present

## 2011-07-10 DIAGNOSIS — I10 Essential (primary) hypertension: Secondary | ICD-10-CM | POA: Diagnosis not present

## 2011-07-25 DIAGNOSIS — S52599A Other fractures of lower end of unspecified radius, initial encounter for closed fracture: Secondary | ICD-10-CM | POA: Diagnosis not present

## 2011-08-01 DIAGNOSIS — M719 Bursopathy, unspecified: Secondary | ICD-10-CM | POA: Diagnosis not present

## 2011-08-01 DIAGNOSIS — M67919 Unspecified disorder of synovium and tendon, unspecified shoulder: Secondary | ICD-10-CM | POA: Diagnosis not present

## 2011-08-01 DIAGNOSIS — S52599A Other fractures of lower end of unspecified radius, initial encounter for closed fracture: Secondary | ICD-10-CM | POA: Diagnosis not present

## 2011-08-06 DIAGNOSIS — S52599A Other fractures of lower end of unspecified radius, initial encounter for closed fracture: Secondary | ICD-10-CM | POA: Diagnosis not present

## 2011-08-08 DIAGNOSIS — M719 Bursopathy, unspecified: Secondary | ICD-10-CM | POA: Diagnosis not present

## 2011-08-08 DIAGNOSIS — S52599A Other fractures of lower end of unspecified radius, initial encounter for closed fracture: Secondary | ICD-10-CM | POA: Diagnosis not present

## 2011-08-12 DIAGNOSIS — S52599A Other fractures of lower end of unspecified radius, initial encounter for closed fracture: Secondary | ICD-10-CM | POA: Diagnosis not present

## 2011-08-12 DIAGNOSIS — M67919 Unspecified disorder of synovium and tendon, unspecified shoulder: Secondary | ICD-10-CM | POA: Diagnosis not present

## 2011-08-12 DIAGNOSIS — M719 Bursopathy, unspecified: Secondary | ICD-10-CM | POA: Diagnosis not present

## 2011-08-15 DIAGNOSIS — M67919 Unspecified disorder of synovium and tendon, unspecified shoulder: Secondary | ICD-10-CM | POA: Diagnosis not present

## 2011-08-15 DIAGNOSIS — S52599A Other fractures of lower end of unspecified radius, initial encounter for closed fracture: Secondary | ICD-10-CM | POA: Diagnosis not present

## 2011-08-19 DIAGNOSIS — S52599A Other fractures of lower end of unspecified radius, initial encounter for closed fracture: Secondary | ICD-10-CM | POA: Diagnosis not present

## 2011-08-22 DIAGNOSIS — S52599A Other fractures of lower end of unspecified radius, initial encounter for closed fracture: Secondary | ICD-10-CM | POA: Diagnosis not present

## 2011-08-26 DIAGNOSIS — S52599A Other fractures of lower end of unspecified radius, initial encounter for closed fracture: Secondary | ICD-10-CM | POA: Diagnosis not present

## 2011-08-29 DIAGNOSIS — S52599A Other fractures of lower end of unspecified radius, initial encounter for closed fracture: Secondary | ICD-10-CM | POA: Diagnosis not present

## 2011-08-29 DIAGNOSIS — M719 Bursopathy, unspecified: Secondary | ICD-10-CM | POA: Diagnosis not present

## 2011-08-29 DIAGNOSIS — M67919 Unspecified disorder of synovium and tendon, unspecified shoulder: Secondary | ICD-10-CM | POA: Diagnosis not present

## 2011-09-03 DIAGNOSIS — S52599A Other fractures of lower end of unspecified radius, initial encounter for closed fracture: Secondary | ICD-10-CM | POA: Diagnosis not present

## 2011-09-05 DIAGNOSIS — S52599A Other fractures of lower end of unspecified radius, initial encounter for closed fracture: Secondary | ICD-10-CM | POA: Diagnosis not present

## 2011-09-06 DIAGNOSIS — S52599A Other fractures of lower end of unspecified radius, initial encounter for closed fracture: Secondary | ICD-10-CM | POA: Diagnosis not present

## 2011-09-10 DIAGNOSIS — S52599A Other fractures of lower end of unspecified radius, initial encounter for closed fracture: Secondary | ICD-10-CM | POA: Diagnosis not present

## 2011-09-12 DIAGNOSIS — S52599A Other fractures of lower end of unspecified radius, initial encounter for closed fracture: Secondary | ICD-10-CM | POA: Diagnosis not present

## 2011-09-17 DIAGNOSIS — S52599A Other fractures of lower end of unspecified radius, initial encounter for closed fracture: Secondary | ICD-10-CM | POA: Diagnosis not present

## 2011-09-19 DIAGNOSIS — S52599A Other fractures of lower end of unspecified radius, initial encounter for closed fracture: Secondary | ICD-10-CM | POA: Diagnosis not present

## 2011-10-07 DIAGNOSIS — H2510 Age-related nuclear cataract, unspecified eye: Secondary | ICD-10-CM | POA: Diagnosis not present

## 2011-10-11 DIAGNOSIS — S52599A Other fractures of lower end of unspecified radius, initial encounter for closed fracture: Secondary | ICD-10-CM | POA: Diagnosis not present

## 2011-10-23 DIAGNOSIS — H2510 Age-related nuclear cataract, unspecified eye: Secondary | ICD-10-CM | POA: Diagnosis not present

## 2011-11-06 DIAGNOSIS — I10 Essential (primary) hypertension: Secondary | ICD-10-CM | POA: Diagnosis not present

## 2011-11-06 DIAGNOSIS — E785 Hyperlipidemia, unspecified: Secondary | ICD-10-CM | POA: Diagnosis not present

## 2011-11-13 DIAGNOSIS — H2510 Age-related nuclear cataract, unspecified eye: Secondary | ICD-10-CM | POA: Diagnosis not present

## 2012-03-09 DIAGNOSIS — E1129 Type 2 diabetes mellitus with other diabetic kidney complication: Secondary | ICD-10-CM | POA: Diagnosis not present

## 2012-03-09 DIAGNOSIS — E785 Hyperlipidemia, unspecified: Secondary | ICD-10-CM | POA: Diagnosis not present

## 2012-03-09 DIAGNOSIS — I10 Essential (primary) hypertension: Secondary | ICD-10-CM | POA: Diagnosis not present

## 2012-03-09 DIAGNOSIS — Z23 Encounter for immunization: Secondary | ICD-10-CM | POA: Diagnosis not present

## 2012-07-07 DIAGNOSIS — E785 Hyperlipidemia, unspecified: Secondary | ICD-10-CM | POA: Diagnosis not present

## 2012-07-07 DIAGNOSIS — E1129 Type 2 diabetes mellitus with other diabetic kidney complication: Secondary | ICD-10-CM | POA: Diagnosis not present

## 2012-07-07 DIAGNOSIS — N951 Menopausal and female climacteric states: Secondary | ICD-10-CM | POA: Diagnosis not present

## 2012-07-07 DIAGNOSIS — I10 Essential (primary) hypertension: Secondary | ICD-10-CM | POA: Diagnosis not present

## 2012-07-13 DIAGNOSIS — M779 Enthesopathy, unspecified: Secondary | ICD-10-CM | POA: Diagnosis not present

## 2012-07-13 DIAGNOSIS — M2040 Other hammer toe(s) (acquired), unspecified foot: Secondary | ICD-10-CM | POA: Diagnosis not present

## 2012-07-13 DIAGNOSIS — Q828 Other specified congenital malformations of skin: Secondary | ICD-10-CM | POA: Diagnosis not present

## 2012-08-13 DIAGNOSIS — N951 Menopausal and female climacteric states: Secondary | ICD-10-CM | POA: Diagnosis not present

## 2012-11-02 DIAGNOSIS — E1129 Type 2 diabetes mellitus with other diabetic kidney complication: Secondary | ICD-10-CM | POA: Diagnosis not present

## 2012-11-02 DIAGNOSIS — I10 Essential (primary) hypertension: Secondary | ICD-10-CM | POA: Diagnosis not present

## 2012-11-02 DIAGNOSIS — E785 Hyperlipidemia, unspecified: Secondary | ICD-10-CM | POA: Diagnosis not present

## 2012-12-23 DIAGNOSIS — M2040 Other hammer toe(s) (acquired), unspecified foot: Secondary | ICD-10-CM | POA: Diagnosis not present

## 2012-12-23 DIAGNOSIS — M2010 Hallux valgus (acquired), unspecified foot: Secondary | ICD-10-CM | POA: Diagnosis not present

## 2013-03-09 DIAGNOSIS — I10 Essential (primary) hypertension: Secondary | ICD-10-CM | POA: Diagnosis not present

## 2013-03-09 DIAGNOSIS — E785 Hyperlipidemia, unspecified: Secondary | ICD-10-CM | POA: Diagnosis not present

## 2013-03-09 DIAGNOSIS — E119 Type 2 diabetes mellitus without complications: Secondary | ICD-10-CM | POA: Diagnosis not present

## 2013-03-09 DIAGNOSIS — R21 Rash and other nonspecific skin eruption: Secondary | ICD-10-CM | POA: Diagnosis not present

## 2013-05-12 DIAGNOSIS — J209 Acute bronchitis, unspecified: Secondary | ICD-10-CM | POA: Diagnosis not present

## 2013-06-02 ENCOUNTER — Other Ambulatory Visit: Payer: Self-pay | Admitting: Internal Medicine

## 2013-07-13 DIAGNOSIS — N182 Chronic kidney disease, stage 2 (mild): Secondary | ICD-10-CM | POA: Diagnosis not present

## 2013-07-13 DIAGNOSIS — R059 Cough, unspecified: Secondary | ICD-10-CM | POA: Diagnosis not present

## 2013-07-13 DIAGNOSIS — E785 Hyperlipidemia, unspecified: Secondary | ICD-10-CM | POA: Diagnosis not present

## 2013-07-13 DIAGNOSIS — R05 Cough: Secondary | ICD-10-CM | POA: Diagnosis not present

## 2013-07-13 DIAGNOSIS — E1129 Type 2 diabetes mellitus with other diabetic kidney complication: Secondary | ICD-10-CM | POA: Diagnosis not present

## 2013-07-13 DIAGNOSIS — I10 Essential (primary) hypertension: Secondary | ICD-10-CM | POA: Diagnosis not present

## 2013-07-15 ENCOUNTER — Encounter: Payer: Self-pay | Admitting: Podiatry

## 2013-07-15 ENCOUNTER — Ambulatory Visit (INDEPENDENT_AMBULATORY_CARE_PROVIDER_SITE_OTHER): Payer: Medicare Other | Admitting: Podiatry

## 2013-07-15 VITALS — BP 153/68 | HR 63 | Resp 16

## 2013-07-15 DIAGNOSIS — L84 Corns and callosities: Secondary | ICD-10-CM | POA: Diagnosis not present

## 2013-07-15 NOTE — Patient Instructions (Signed)
Diabetes and Foot Care Diabetes may cause you to have problems because of poor blood supply (circulation) to your feet and legs. This may cause the skin on your feet to become thinner, break easier, and heal more slowly. Your skin may become dry, and the skin may peel and crack. You may also have nerve damage in your legs and feet causing decreased feeling in them. You may not notice minor injuries to your feet that could lead to infections or more serious problems. Taking care of your feet is one of the most important things you can do for yourself.  HOME CARE INSTRUCTIONS  Wear shoes at all times, even in the house. Do not go barefoot. Bare feet are easily injured.  Check your feet daily for blisters, cuts, and redness. If you cannot see the bottom of your feet, use a mirror or ask someone for help.  Wash your feet with warm water (do not use hot water) and mild soap. Then pat your feet and the areas between your toes until they are completely dry. Do not soak your feet as this can dry your skin.  Apply a moisturizing lotion or petroleum jelly (that does not contain alcohol and is unscented) to the skin on your feet and to dry, brittle toenails. Do not apply lotion between your toes.  Trim your toenails straight across. Do not dig under them or around the cuticle. File the edges of your nails with an emery board or nail file.  Do not cut corns or calluses or try to remove them with medicine.  Wear clean socks or stockings every day. Make sure they are not too tight. Do not wear knee-high stockings since they may decrease blood flow to your legs.  Wear shoes that fit properly and have enough cushioning. To break in new shoes, wear them for just a few hours a day. This prevents you from injuring your feet. Always look in your shoes before you put them on to be sure there are no objects inside.  Do not cross your legs. This may decrease the blood flow to your feet.  If you find a minor scrape,  cut, or break in the skin on your feet, keep it and the skin around it clean and dry. These areas may be cleansed with mild soap and water. Do not cleanse the area with peroxide, alcohol, or iodine.  When you remove an adhesive bandage, be sure not to damage the skin around it.  If you have a wound, look at it several times a day to make sure it is healing.  Do not use heating pads or hot water bottles. They may burn your skin. If you have lost feeling in your feet or legs, you may not know it is happening until it is too late.  Make sure your health care provider performs a complete foot exam at least annually or more often if you have foot problems. Report any cuts, sores, or bruises to your health care provider immediately. SEEK MEDICAL CARE IF:   You have an injury that is not healing.  You have cuts or breaks in the skin.  You have an ingrown nail.  You notice redness on your legs or feet.  You feel burning or tingling in your legs or feet.  You have pain or cramps in your legs and feet.  Your legs or feet are numb.  Your feet always feel cold. SEEK IMMEDIATE MEDICAL CARE IF:   There is increasing redness,   swelling, or pain in or around a wound.  There is a red line that goes up your leg.  Pus is coming from a wound.  You develop a fever or as directed by your health care provider.  You notice a bad smell coming from an ulcer or wound. Document Released: 04/12/2000 Document Revised: 12/16/2012 Document Reviewed: 09/22/2012 [employer] Patient Information 2014 [employer].  

## 2013-07-16 NOTE — Progress Notes (Signed)
Subjective:     Patient ID: Brandi Griffin, female   DOB: May 06, 1934, 79 y.o.   MRN: [id number]  HPI patient is found to have lesions on the plantar aspect of both feet that she cannot cut and become tender   Review of Systems     Objective:   Physical Exam Neurovascular status unchanged with lesion formation plantar aspect of both feet    Assessment:     Keratotic lesions that are painful both feet plantar    Plan:     Debridement of lesions on both feet

## 2014-03-07 DIAGNOSIS — I10 Essential (primary) hypertension: Secondary | ICD-10-CM | POA: Diagnosis not present

## 2014-03-07 DIAGNOSIS — E1122 Type 2 diabetes mellitus with diabetic chronic kidney disease: Secondary | ICD-10-CM | POA: Diagnosis not present

## 2014-03-07 DIAGNOSIS — Z1389 Encounter for screening for other disorder: Secondary | ICD-10-CM | POA: Diagnosis not present

## 2014-03-07 DIAGNOSIS — N182 Chronic kidney disease, stage 2 (mild): Secondary | ICD-10-CM | POA: Diagnosis not present

## 2014-03-07 DIAGNOSIS — Z0000 Encounter for general adult medical examination without abnormal findings: Secondary | ICD-10-CM | POA: Diagnosis not present

## 2014-03-07 DIAGNOSIS — D151 Benign neoplasm of heart: Secondary | ICD-10-CM | POA: Diagnosis not present

## 2014-03-07 DIAGNOSIS — E784 Other hyperlipidemia: Secondary | ICD-10-CM | POA: Diagnosis not present

## 2014-03-07 DIAGNOSIS — Z23 Encounter for immunization: Secondary | ICD-10-CM | POA: Diagnosis not present

## 2014-07-05 DIAGNOSIS — I10 Essential (primary) hypertension: Secondary | ICD-10-CM | POA: Diagnosis not present

## 2014-07-05 DIAGNOSIS — E1165 Type 2 diabetes mellitus with hyperglycemia: Secondary | ICD-10-CM | POA: Diagnosis not present

## 2014-07-05 DIAGNOSIS — E785 Hyperlipidemia, unspecified: Secondary | ICD-10-CM | POA: Diagnosis not present

## 2014-07-05 DIAGNOSIS — H578 Other specified disorders of eye and adnexa: Secondary | ICD-10-CM | POA: Diagnosis not present

## 2014-11-01 DIAGNOSIS — D151 Benign neoplasm of heart: Secondary | ICD-10-CM | POA: Diagnosis not present

## 2014-11-01 DIAGNOSIS — E139 Other specified diabetes mellitus without complications: Secondary | ICD-10-CM | POA: Diagnosis not present

## 2014-11-01 DIAGNOSIS — E785 Hyperlipidemia, unspecified: Secondary | ICD-10-CM | POA: Diagnosis not present

## 2014-11-01 DIAGNOSIS — I10 Essential (primary) hypertension: Secondary | ICD-10-CM | POA: Diagnosis not present

## 2014-11-07 ENCOUNTER — Ambulatory Visit (HOSPITAL_COMMUNITY): Payer: Medicare Other | Attending: Internal Medicine

## 2014-11-07 ENCOUNTER — Other Ambulatory Visit: Payer: Self-pay | Admitting: Internal Medicine

## 2014-11-07 ENCOUNTER — Other Ambulatory Visit: Payer: Self-pay

## 2014-11-07 DIAGNOSIS — I059 Rheumatic mitral valve disease, unspecified: Secondary | ICD-10-CM | POA: Insufficient documentation

## 2014-11-07 DIAGNOSIS — D151 Benign neoplasm of heart: Secondary | ICD-10-CM

## 2014-11-07 DIAGNOSIS — I350 Nonrheumatic aortic (valve) stenosis: Secondary | ICD-10-CM | POA: Diagnosis not present

## 2014-11-07 DIAGNOSIS — I071 Rheumatic tricuspid insufficiency: Secondary | ICD-10-CM | POA: Diagnosis not present

## 2015-07-19 DIAGNOSIS — I10 Essential (primary) hypertension: Secondary | ICD-10-CM | POA: Diagnosis not present

## 2015-07-19 DIAGNOSIS — E7800 Pure hypercholesterolemia, unspecified: Secondary | ICD-10-CM | POA: Diagnosis not present

## 2015-07-19 DIAGNOSIS — E139 Other specified diabetes mellitus without complications: Secondary | ICD-10-CM | POA: Diagnosis not present

## 2015-07-19 DIAGNOSIS — M25531 Pain in right wrist: Secondary | ICD-10-CM | POA: Diagnosis not present

## 2015-08-18 DIAGNOSIS — H903 Sensorineural hearing loss, bilateral: Secondary | ICD-10-CM | POA: Diagnosis not present

## 2015-11-14 ENCOUNTER — Ambulatory Visit
Admission: RE | Admit: 2015-11-14 | Discharge: 2015-11-14 | Disposition: A | Discharge status: Home / Self Care | Payer: Medicare Other | Source: Ambulatory Visit | Attending: Internal Medicine | Admitting: Internal Medicine

## 2015-11-14 ENCOUNTER — Other Ambulatory Visit: Payer: Self-pay | Admitting: Internal Medicine

## 2015-11-14 DIAGNOSIS — Z0000 Encounter for general adult medical examination without abnormal findings: Secondary | ICD-10-CM | POA: Diagnosis not present

## 2015-11-14 DIAGNOSIS — Z1389 Encounter for screening for other disorder: Secondary | ICD-10-CM | POA: Diagnosis not present

## 2015-11-14 DIAGNOSIS — E7800 Pure hypercholesterolemia, unspecified: Secondary | ICD-10-CM | POA: Diagnosis not present

## 2015-11-14 DIAGNOSIS — M79604 Pain in right leg: Secondary | ICD-10-CM

## 2015-11-14 DIAGNOSIS — I10 Essential (primary) hypertension: Secondary | ICD-10-CM | POA: Diagnosis not present

## 2015-11-14 DIAGNOSIS — M1611 Unilateral primary osteoarthritis, right hip: Secondary | ICD-10-CM | POA: Diagnosis not present

## 2015-11-14 DIAGNOSIS — Z7189 Other specified counseling: Secondary | ICD-10-CM | POA: Diagnosis not present

## 2015-11-14 DIAGNOSIS — R413 Other amnesia: Secondary | ICD-10-CM | POA: Diagnosis not present

## 2015-11-14 DIAGNOSIS — Z1211 Encounter for screening for malignant neoplasm of colon: Secondary | ICD-10-CM | POA: Diagnosis not present

## 2015-11-14 DIAGNOSIS — E139 Other specified diabetes mellitus without complications: Secondary | ICD-10-CM | POA: Diagnosis not present

## 2015-11-28 DIAGNOSIS — R413 Other amnesia: Secondary | ICD-10-CM | POA: Diagnosis not present

## 2015-11-28 DIAGNOSIS — M25551 Pain in right hip: Secondary | ICD-10-CM | POA: Diagnosis not present

## 2015-11-29 DIAGNOSIS — E559 Vitamin D deficiency, unspecified: Secondary | ICD-10-CM | POA: Diagnosis not present

## 2015-11-29 DIAGNOSIS — M1611 Unilateral primary osteoarthritis, right hip: Secondary | ICD-10-CM | POA: Diagnosis not present

## 2015-11-29 DIAGNOSIS — M25551 Pain in right hip: Secondary | ICD-10-CM | POA: Diagnosis not present

## 2015-12-05 DIAGNOSIS — Z780 Asymptomatic menopausal state: Secondary | ICD-10-CM | POA: Diagnosis not present

## 2015-12-05 DIAGNOSIS — M8589 Other specified disorders of bone density and structure, multiple sites: Secondary | ICD-10-CM | POA: Diagnosis not present

## 2015-12-20 DIAGNOSIS — M25551 Pain in right hip: Secondary | ICD-10-CM | POA: Diagnosis not present

## 2015-12-20 DIAGNOSIS — E559 Vitamin D deficiency, unspecified: Secondary | ICD-10-CM | POA: Diagnosis not present

## 2015-12-20 DIAGNOSIS — M1611 Unilateral primary osteoarthritis, right hip: Secondary | ICD-10-CM | POA: Diagnosis not present

## 2016-03-29 DIAGNOSIS — I10 Essential (primary) hypertension: Secondary | ICD-10-CM | POA: Diagnosis not present

## 2016-03-29 DIAGNOSIS — R413 Other amnesia: Secondary | ICD-10-CM | POA: Diagnosis not present

## 2016-03-29 DIAGNOSIS — E7800 Pure hypercholesterolemia, unspecified: Secondary | ICD-10-CM | POA: Diagnosis not present

## 2016-03-29 DIAGNOSIS — E538 Deficiency of other specified B group vitamins: Secondary | ICD-10-CM | POA: Diagnosis not present

## 2016-03-29 DIAGNOSIS — Z23 Encounter for immunization: Secondary | ICD-10-CM | POA: Diagnosis not present

## 2016-03-29 DIAGNOSIS — E139 Other specified diabetes mellitus without complications: Secondary | ICD-10-CM | POA: Diagnosis not present

## 2016-07-30 DIAGNOSIS — E139 Other specified diabetes mellitus without complications: Secondary | ICD-10-CM | POA: Diagnosis not present

## 2016-07-30 DIAGNOSIS — E538 Deficiency of other specified B group vitamins: Secondary | ICD-10-CM | POA: Diagnosis not present

## 2016-07-30 DIAGNOSIS — I10 Essential (primary) hypertension: Secondary | ICD-10-CM | POA: Diagnosis not present

## 2016-07-30 DIAGNOSIS — E7800 Pure hypercholesterolemia, unspecified: Secondary | ICD-10-CM | POA: Diagnosis not present

## 2016-07-30 DIAGNOSIS — R413 Other amnesia: Secondary | ICD-10-CM | POA: Diagnosis not present

## 2016-08-29 ENCOUNTER — Encounter: Payer: Self-pay | Admitting: Neurology

## 2016-08-29 DIAGNOSIS — R413 Other amnesia: Secondary | ICD-10-CM | POA: Diagnosis not present

## 2016-09-11 ENCOUNTER — Encounter: Payer: Self-pay | Admitting: Neurology

## 2016-09-11 ENCOUNTER — Ambulatory Visit (INDEPENDENT_AMBULATORY_CARE_PROVIDER_SITE_OTHER): Payer: Medicare Other | Admitting: Neurology

## 2016-09-11 VITALS — BP 140/80 | HR 80 | Ht 60.0 in | Wt 131.3 lb

## 2016-09-11 DIAGNOSIS — F0390 Unspecified dementia without behavioral disturbance: Secondary | ICD-10-CM | POA: Diagnosis not present

## 2016-09-11 DIAGNOSIS — F03A0 Unspecified dementia, mild, without behavioral disturbance, psychotic disturbance, mood disturbance, and anxiety: Secondary | ICD-10-CM | POA: Insufficient documentation

## 2016-09-11 MED ORDER — DONEPEZIL HCL 10 MG PO TABS: ORAL_TABLET | ORAL | 11 refills | Status: DC

## 2016-09-11 NOTE — Progress Notes (Signed)
NEUROLOGY CONSULTATION NOTE  Brandi Griffin MRN: [id number] DOB: 10-27-1934  Referring provider: Dr. Seward Carol Primary care provider: Dr. Seward Carol  Reason for consult:  Memory loss  Dear Dr Delfina Redwood:  Thank you for your kind referral of Brandi Griffin for consultation of the above symptoms. Although her history is well known to you, please allow me to reiterate it for the purpose of our medical record. The patient was accompanied to the clinic by daughter and granddaughter who also provide collateral information. Records and images were personally reviewed where available.  HISTORY OF PRESENT ILLNESS: This is a very pleasant 82 year old right-handed woman with a history of hypertension, diabetes, presenting for evaluation of worsening memory. She feels her memory is wonderful some days, but other days she shows the thumbs down sign. She has been living with her daughter for the past 20 years and would be told she repeats herself. Her daughter started noticing changes gradually over the past 6-8 months. She repeats herself more frequently now. They would pass by a place and she reminisces about it, then says the same thing on the way back. She would ask her daughter where she wanted to go, then get in the car and ask where she was going. Her granddaughter reports the same issues. She would forget she why she called her earlier. She fixes her pillbox but forgets her medications, even with an alarm. Her daughter set her alarm clock, she would not know why it was ringing. She denies getting lost driving, but family has restricted her from driving after an incident at [facility] 3 weeks ago. She went in to get diapers and got the wrong one. She went back in to exchange it, then apparently thought someone had taken them and caused an issue at the store. They went back to look at the video and saw her putting the diapers back on the shelf. She does not recall this incident. She looks for her  keys and wallet all the time. She puts dishes where they don't usually go. Her daughter will be taking over the bills, she would either forget or overpay for the past 4-5 months. She can bathe and dress herself, but would wear the same things or forget to bathe. No significant personality changes, no hallucinations.  She has noticed a decrease in her sense of smell. Otherwise she denies any headaches, dizziness, diplopia, dysarthria/dysphagia, neck/back pain, focal numbness/tingling/weakness, bowel/bladder dysfunction, or tremors. She has occasional falls, her daughter states she does not lift her feet up, last fall was 3 months ago or so. They deny any family history of dementia but she was adopted, no history of significant head injury or alcohol use.   Laboratory Data: TSH and B12 normal per family.  PAST MEDICAL HISTORY: Past Medical History:  Diagnosis Date  . Diabetes mellitus without complication     PAST SURGICAL HISTORY: No past surgical history on file.  MEDICATIONS: Current Outpatient Prescriptions on File Prior to Visit  Medication Sig Dispense Refill  . aspirin 325 MG EC tablet Take 325 mg by mouth daily.    . BD ULTRA-FINE LANCETS lancets by Other route. Use as instructed    . calcium carbonate (OS-CAL) 600 MG TABS tablet Take 600 mg by mouth 2 (two) times daily with a meal.    . Glucose Blood (FREESTYLE LITE TEST VI) by In Vitro route.    Marland Kitchen glucose blood test strip 1 each by Other route as needed for other. Use  as instructed    . hydrochlorothiazide (HYDRODIURIL) 25 MG tablet Take 25 mg by mouth daily.    Marland Kitchen lisinopril (PRINIVIL,ZESTRIL) 20 MG tablet Take 20 mg by mouth daily.     No current facility-administered medications on file prior to visit.     ALLERGIES: No Known Allergies  FAMILY HISTORY: No family history on file.  SOCIAL HISTORY: Social History   Social History  . Marital status: Divorced    Spouse name: N/A  . Number of children: N/A  . Years of  education: N/A   Occupational History  . Not on file.   Social History Main Topics  . Smoking status: Never Smoker  . Smokeless tobacco: Not on file  . Alcohol use Yes  . Drug use: Unknown  . Sexual activity: Not on file   Other Topics Concern  . Not on file   Social History Narrative  . No narrative on file    REVIEW OF SYSTEMS: Constitutional: No fevers, chills, or sweats, no generalized fatigue, change in appetite Eyes: No visual changes, double vision, eye pain Ear, nose and throat: No hearing loss, ear pain, nasal congestion, sore throat Cardiovascular: No chest pain, palpitations Respiratory:  No shortness of breath at rest or with exertion, wheezes GastrointestinaI: No nausea, vomiting, diarrhea, abdominal pain, fecal incontinence Genitourinary:  No dysuria, urinary retention or frequency Musculoskeletal:  No neck pain, back pain Integumentary: No rash, pruritus, skin lesions Neurological: as above Psychiatric: No depression, insomnia, anxiety Endocrine: No palpitations, fatigue, diaphoresis, mood swings, change in appetite, change in weight, increased thirst Hematologic/Lymphatic:  No anemia, purpura, petechiae. Allergic/Immunologic: no itchy/runny eyes, nasal congestion, recent allergic reactions, rashes  PHYSICAL EXAM: Vitals:   09/11/16 1303  BP: 140/80  Pulse: 80   General: No acute distress Head:  Normocephalic/atraumatic Eyes: Fundoscopic exam shows bilateral sharp discs, no vessel changes, exudates, or hemorrhages Neck: supple, no paraspinal tenderness, full range of motion Back: No paraspinal tenderness Heart: regular rate and rhythm Lungs: Clear to auscultation bilaterally. Vascular: No carotid bruits. Skin/Extremities: No rash, no edema Neurological Exam: Mental status: alert and oriented to person, place, and time, no dysarthria or aphasia, Fund of knowledge is appropriate.  Recent and remote memory are impaired.  Attention and concentration are  normal.    Able to name objects and repeat phrases. CDT 5/5 MMSE - Mini Mental State Exam 09/11/2016  Orientation to time 4  Orientation to Place 5  Registration 3  Attention/ Calculation 5  Recall 0  Language- name 2 objects 2  Language- repeat 1  Language- follow 3 step command 3  Language- read & follow direction 1  Write a sentence 1  Copy design 1  Total score 26   Cranial nerves: CN I: not tested CN II: pupils equal, round and reactive to light, visual fields intact, fundi unremarkable. CN III, IV, VI:  full range of motion, no nystagmus, no ptosis CN V: facial sensation intact CN VII: upper and lower face symmetric CN VIII: hearing intact to finger rub CN IX, X: gag intact, uvula midline CN XI: sternocleidomastoid and trapezius muscles intact CN XII: tongue midline Bulk & Tone: normal, no fasciculations. Motor: 5/5 throughout with no pronator drift, feet everted Sensation: intact to light touch, cold, pin, vibration and joint position sense.  No extinction to double simultaneous stimulation.  Romberg test negative Deep Tendon Reflexes: brisk +2 throughout, no ankle clonus, negative Hoffman sign Plantar responses: downgoing bilaterally Cerebellar: no incoordination on finger to nose, heel to shin.  No dysdiadochokinesia Gait: feet everted, flat foot bilaterally, slightly wide-based gait, no ataxia, able to tandem walk adequately. Tremor: none  IMPRESSION: This is a very pleasant 82 year old right-handed woman with a history of hypertension, diabetes, presenting with worsening memory. Neurological exam is non-focal. MMSE today 26/30, however history suggestive of mild dementia. TSH and B12 reported as normal. MRI brain without contrast will be ordered to assess for underlying structural abnormality and assess vascular load. She will start Aricept 10mg : 1/2 tablet daily for 1 month, then increase to 1 tablet daily. Side effects and expectations from the medication were discussed.  She was advised to stop driving. She was advised to use a pillbox with a built-in alarm to help with medication compliance. We discussed the importance of control of vascular risk factors, physical exercise, and brain stimulation exercises for brain health. She will follow-up in 6 months and knows to call for any changes.   Thank you for allowing me to participate in the care of this patient. Please do not hesitate to call for any questions or concerns.   Ellouise Newer, M.D.  CC: Dr. Delfina Redwood

## 2016-09-11 NOTE — Patient Instructions (Addendum)
1. Schedule MRI brain without contrast 2. Start Aricept 10mg : Take 1/2 tablet daily for 1 month, then increase to 1 tablet daily 3. Continue control of blood pressure, cholesterol, diabetes, as well as physical exercise and brain stimulation exercises are important for brain health 4. Start using a pillbox with alarm to help with medication reminders 5. No further driving 6. Follow-up in 6 months, call for any changes

## 2016-09-18 ENCOUNTER — Telehealth: Payer: Self-pay | Admitting: Neurology

## 2016-09-18 NOTE — Telephone Encounter (Signed)
Patient's daughter called back. She thought she missed a call from you. Thanks

## 2016-09-18 NOTE — Telephone Encounter (Signed)
PT's daughter [name] called and said PT is acting really bazaar and wants to know if it is the medication Aricept that she is on and would like a call back

## 2016-09-19 NOTE — Telephone Encounter (Signed)
Called number listed for pt's daughter yesterday.  No answer no machine/voicemail

## 2016-10-24 ENCOUNTER — Ambulatory Visit: Payer: Self-pay | Admitting: Neurology

## 2016-10-29 ENCOUNTER — Ambulatory Visit
Admission: RE | Admit: 2016-10-29 | Discharge: 2016-10-29 | Disposition: A | Discharge status: Home / Self Care | Payer: Medicare Other | Source: Ambulatory Visit | Attending: Neurology | Admitting: Neurology

## 2016-10-29 DIAGNOSIS — F03A0 Unspecified dementia, mild, without behavioral disturbance, psychotic disturbance, mood disturbance, and anxiety: Secondary | ICD-10-CM

## 2016-10-29 DIAGNOSIS — F0390 Unspecified dementia without behavioral disturbance: Secondary | ICD-10-CM | POA: Diagnosis not present

## 2016-10-31 ENCOUNTER — Telehealth: Payer: Self-pay

## 2016-10-31 NOTE — Telephone Encounter (Signed)
-----   Message from Cameron Sprang, MD sent at 10/31/2016  8:50 AM EDT ----- Pls let her know MRI brain did not show any evidence of tumor, stroke, or bleed. It showed age-related changes, and changes seen with patients with diabetes and blood pressure issues, continue control of these conditions. Thanks

## 2016-10-31 NOTE — Telephone Encounter (Signed)
LMOM with daughter, Hilda Blades, relaying message below

## 2016-11-13 DIAGNOSIS — Z0000 Encounter for general adult medical examination without abnormal findings: Secondary | ICD-10-CM | POA: Diagnosis not present

## 2016-11-13 DIAGNOSIS — Z1389 Encounter for screening for other disorder: Secondary | ICD-10-CM | POA: Diagnosis not present

## 2016-11-13 DIAGNOSIS — Z7189 Other specified counseling: Secondary | ICD-10-CM | POA: Diagnosis not present

## 2016-12-02 ENCOUNTER — Encounter (HOSPITAL_COMMUNITY): Payer: Self-pay | Admitting: Emergency Medicine

## 2016-12-02 DIAGNOSIS — Z7982 Long term (current) use of aspirin: Secondary | ICD-10-CM | POA: Diagnosis not present

## 2016-12-02 DIAGNOSIS — I160 Hypertensive urgency: Secondary | ICD-10-CM | POA: Diagnosis not present

## 2016-12-02 DIAGNOSIS — Z79899 Other long term (current) drug therapy: Secondary | ICD-10-CM | POA: Insufficient documentation

## 2016-12-02 DIAGNOSIS — I10 Essential (primary) hypertension: Secondary | ICD-10-CM | POA: Insufficient documentation

## 2016-12-02 DIAGNOSIS — E119 Type 2 diabetes mellitus without complications: Secondary | ICD-10-CM | POA: Insufficient documentation

## 2016-12-02 DIAGNOSIS — F0390 Unspecified dementia without behavioral disturbance: Secondary | ICD-10-CM | POA: Diagnosis not present

## 2016-12-02 DIAGNOSIS — R4182 Altered mental status, unspecified: Secondary | ICD-10-CM | POA: Diagnosis not present

## 2016-12-02 LAB — CBC
HCT: 36.3 % (ref 36.0–46.0)
Hemoglobin: 12.1 g/dL (ref 12.0–15.0)
MCH: 27.9 pg (ref 26.0–34.0)
MCHC: 33.3 g/dL (ref 30.0–36.0)
MCV: 83.8 fL (ref 78.0–100.0)
PLATELETS: 318 10*3/uL (ref 150–400)
RBC: 4.33 MIL/uL (ref 3.87–5.11)
RDW: 13.6 % (ref 11.5–15.5)
WBC: 6.9 10*3/uL (ref 4.0–10.5)

## 2016-12-02 LAB — COMPREHENSIVE METABOLIC PANEL
ALK PHOS: 94 U/L (ref 38–126)
ALT: 9 U/L — AB (ref 14–54)
AST: 14 U/L — ABNORMAL LOW (ref 15–41)
Albumin: 3.6 g/dL (ref 3.5–5.0)
Anion gap: 9 (ref 5–15)
BILIRUBIN TOTAL: 0.5 mg/dL (ref 0.3–1.2)
BUN: 20 mg/dL (ref 6–20)
CALCIUM: 8.8 mg/dL — AB (ref 8.9–10.3)
CO2: 25 mmol/L (ref 22–32)
CREATININE: 1.09 mg/dL — AB (ref 0.44–1.00)
Chloride: 104 mmol/L (ref 101–111)
GFR calc Af Amer: 54 mL/min — ABNORMAL LOW (ref >60)
GFR, EST NON AFRICAN AMERICAN: 47 mL/min — AB (ref >60)
Glucose, Bld: 177 mg/dL — ABNORMAL HIGH (ref 65–99)
Potassium: 3.5 mmol/L (ref 3.5–5.1)
Sodium: 138 mmol/L (ref 135–145)
TOTAL PROTEIN: 6.8 g/dL (ref 6.5–8.1)

## 2016-12-02 LAB — CBG MONITORING, ED: GLUCOSE-CAPILLARY: 169 mg/dL — AB (ref 65–99)

## 2016-12-02 NOTE — ED Triage Notes (Signed)
Family brought pt in due to a sudden onset of confusion.  Pt believed the TV remote was a phone, not remembering recent events.  Pt also has a headache that has slightly decreased.  Negative NIH scale in triage.

## 2016-12-03 ENCOUNTER — Emergency Department (HOSPITAL_COMMUNITY): Payer: Medicare Other

## 2016-12-03 ENCOUNTER — Emergency Department (HOSPITAL_COMMUNITY)
Admission: EM | Admit: 2016-12-03 | Discharge: 2016-12-03 | Disposition: A | Discharge status: Home / Self Care | Payer: Medicare Other | Attending: Emergency Medicine | Admitting: Emergency Medicine

## 2016-12-03 ENCOUNTER — Encounter (HOSPITAL_COMMUNITY): Payer: Self-pay | Admitting: Emergency Medicine

## 2016-12-03 DIAGNOSIS — I10 Essential (primary) hypertension: Secondary | ICD-10-CM | POA: Insufficient documentation

## 2016-12-03 DIAGNOSIS — I160 Hypertensive urgency: Secondary | ICD-10-CM

## 2016-12-03 DIAGNOSIS — Z5321 Procedure and treatment not carried out due to patient leaving prior to being seen by health care provider: Secondary | ICD-10-CM

## 2016-12-03 DIAGNOSIS — R030 Elevated blood-pressure reading, without diagnosis of hypertension: Secondary | ICD-10-CM | POA: Diagnosis not present

## 2016-12-03 DIAGNOSIS — R4182 Altered mental status, unspecified: Secondary | ICD-10-CM | POA: Diagnosis not present

## 2016-12-03 HISTORY — DX: Essential (primary) hypertension: I10

## 2016-12-03 HISTORY — DX: Malignant (primary) neoplasm, unspecified: C80.1

## 2016-12-03 MED ORDER — LABETALOL HCL 200 MG PO TABS
100.0000 mg | ORAL_TABLET | Freq: Once | ORAL | Status: DC
  Filled 2016-12-03: qty 1

## 2016-12-03 MED ORDER — CLONIDINE HCL 0.2 MG PO TABS
0.2000 mg | ORAL_TABLET | Freq: Once | ORAL | Status: AC
  Administered 2016-12-03: 0.2 mg via ORAL
  Filled 2016-12-03: qty 1

## 2016-12-03 NOTE — ED Triage Notes (Signed)
Pt to ED via [facility] with c/o HTN and headache.  Pt st's she has not been taking her hypertensive meds like she is suppose to.  Pt seen here last night for HTN and confusion.  Pt alert and oriented x's 3

## 2016-12-03 NOTE — ED Provider Notes (Signed)
[facility] DEPT Provider Note   CSN: [id number] Arrival date & time: 12/02/16  2157     History   Chief Complaint Chief Complaint  Patient presents with  . Altered Mental Status  . Headache    HPI Brandi Griffin is a 82 y.o. Griffin.  Patient is a 82 year old Griffin with history of diabetes and hypertension. She presents today for evaluation of confusion. According to the family at bedside, she has been more confused over the past several days. This evening she was holding the remote control to her ear bleeding that it was a telephone. She denies any fevers or chills. She denies any specific aches or pains with the exception of a headache. She does tell me that she did not take her blood pressure medicines today, but cannot enunciate why.   The history is provided by the patient.  Altered Mental Status   This is a new problem. The current episode started yesterday. The problem has been gradually worsening. Associated symptoms include confusion. Pertinent negatives include no unresponsiveness. Her past medical history does not include seizures or CVA.  Headache      Past Medical History:  Diagnosis Date  . Cancer ([place])   . Diabetes mellitus without complication ([place])   . Hypertension     Patient Active Problem List   Diagnosis Date Noted  . Mild dementia 09/11/2016    History reviewed. No pertinent surgical history.  OB History    No data available       Home Medications    Prior to Admission medications   Medication Sig Start Date End Date Taking? Authorizing Provider  aspirin 325 MG EC tablet Take 325 mg by mouth daily.    [provider]  BD ULTRA-FINE LANCETS lancets by Other route. Use as instructed    [provider]  calcium carbonate (OS-CAL) 600 MG TABS tablet Take 600 mg by mouth 2 (two) times daily with a meal.    [provider]  donepezil (ARICEPT) 10 MG tablet Take 1/2 tablet daily for 1 month, then increase to 1  tablet daily 09/11/16   Cameron Sprang, MD  Glucose Blood (FREESTYLE LITE TEST VI) by In Vitro route.    [provider]  glucose blood test strip 1 each by Other route as needed for other. Use as instructed    [provider]  hydrochlorothiazide (HYDRODIURIL) 25 MG tablet Take 25 mg by mouth daily.    [provider]  lisinopril (PRINIVIL,ZESTRIL) 20 MG tablet Take 20 mg by mouth daily.    [provider]  pioglitazone (ACTOS) 30 MG tablet TK 1 T PO QD 06/10/16   [provider]  Vitamin D, Ergocalciferol, (DRISDOL) 50000 units CAPS capsule TK 1 C PO Q WK FOR 12 WKS 07/09/16   [provider]    Family History No family history on file.  Social History Social History  Substance Use Topics  . Smoking status: Never Smoker  . Smokeless tobacco: Never Used  . Alcohol use Yes     Allergies   Patient has no known allergies.   Review of Systems Review of Systems  Neurological: Positive for headaches.  Psychiatric/Behavioral: Positive for confusion.  All other systems reviewed and are negative.    Physical Exam Updated Vital Signs BP (!) 222/71 (BP Location: Right Arm)   Pulse 60   Temp 99 F (37.2 C) (Oral)   Resp 16   SpO2 96%   Physical Exam  Constitutional:  She is oriented to person, place, and time. She appears well-developed and well-nourished. No distress.  HENT:  Head: Normocephalic and atraumatic.  Eyes: Pupils are equal, round, and reactive to light. EOM are normal.  Neck: Normal range of motion. Neck supple.  Cardiovascular: Normal rate and regular rhythm.  Exam reveals no gallop and no friction rub.   No murmur heard. Pulmonary/Chest: Effort normal and breath sounds normal. No respiratory distress. She has no wheezes.  Abdominal: Soft. Bowel sounds are normal. She exhibits no distension. There is no tenderness.  Musculoskeletal: Normal range of motion.  Neurological: She is alert and oriented to person,  place, and time. No cranial nerve deficit. She exhibits normal muscle tone. Coordination normal.  Skin: Skin is warm and dry. She is not diaphoretic.  Nursing note and vitals reviewed.    ED Treatments / Results  Labs (all labs ordered are listed, but only abnormal results are displayed) Labs Reviewed  COMPREHENSIVE METABOLIC PANEL - Abnormal; Notable for the following:       Result Value   Glucose, Bld 177 (*)    Creatinine, Ser 1.09 (*)    Calcium 8.8 (*)    AST 14 (*)    ALT 9 (*)    GFR calc non Af Amer 47 (*)    GFR calc Af Amer 54 (*)    All other components within normal limits  CBG MONITORING, ED - Abnormal; Notable for the following:    Glucose-Capillary 169 (*)    All other components within normal limits  CBC    EKG  EKG Interpretation None       Radiology No results found.  Procedures Procedures (including critical care time)  Medications Ordered in ED Medications  labetalol (NORMODYNE) tablet 100 mg (not administered)     Initial Impression / Assessment and Plan / ED Course  I have reviewed the triage vital signs and the nursing notes.  Pertinent labs & imaging results that were available during my care of the patient were reviewed by me and considered in my medical decision making (see chart for details).  Patient presents here with complaints of confusion and elevated blood pressure. Her blood pressure was initially over 741 systolic, however responded nicely to clonidine. Head CT is negative and laboratory studies are otherwise unremarkable. I see no indication at this time for admission and feel as though the patient is appropriate for discharge. According to the family, she has returned to her baseline. She appears very pleasant and is neurologically intact.  Final Clinical Impressions(s) / ED Diagnoses   Final diagnoses:  None    New Prescriptions New Prescriptions   No medications on file     Veryl Speak, MD 12/03/16 [phone]

## 2016-12-03 NOTE — Discharge Instructions (Signed)
Continue your blood pressure medications as previously prescribed.  Return to the emergency department if your symptoms significantly worsen or change.

## 2016-12-04 ENCOUNTER — Emergency Department (HOSPITAL_COMMUNITY)
Admission: EM | Admit: 2016-12-04 | Discharge: 2016-12-04 | Disposition: A | Discharge status: Home / Self Care | Payer: Medicare Other | Source: Home / Self Care

## 2016-12-04 DIAGNOSIS — I10 Essential (primary) hypertension: Secondary | ICD-10-CM | POA: Diagnosis not present

## 2016-12-04 DIAGNOSIS — E1165 Type 2 diabetes mellitus with hyperglycemia: Secondary | ICD-10-CM | POA: Diagnosis not present

## 2016-12-04 DIAGNOSIS — R413 Other amnesia: Secondary | ICD-10-CM | POA: Diagnosis not present

## 2016-12-04 DIAGNOSIS — E7800 Pure hypercholesterolemia, unspecified: Secondary | ICD-10-CM | POA: Diagnosis not present

## 2016-12-04 DIAGNOSIS — E089 Diabetes mellitus due to underlying condition without complications: Secondary | ICD-10-CM | POA: Diagnosis not present

## 2016-12-04 NOTE — ED Notes (Signed)
Dorothea Ogle, NT took vitals of patient.

## 2017-02-27 ENCOUNTER — Encounter (HOSPITAL_COMMUNITY): Payer: Self-pay

## 2017-02-27 ENCOUNTER — Emergency Department (HOSPITAL_COMMUNITY): Payer: Medicare Other

## 2017-02-27 ENCOUNTER — Inpatient Hospital Stay (HOSPITAL_COMMUNITY)
Admission: EM | Admit: 2017-02-27 | Discharge: 2017-03-02 | DRG: 065 | Disposition: A | Discharge status: Home / Self Care | Payer: Medicare Other | Attending: Internal Medicine | Admitting: Internal Medicine

## 2017-02-27 DIAGNOSIS — R4701 Aphasia: Secondary | ICD-10-CM | POA: Diagnosis present

## 2017-02-27 DIAGNOSIS — Z90710 Acquired absence of both cervix and uterus: Secondary | ICD-10-CM | POA: Diagnosis not present

## 2017-02-27 DIAGNOSIS — R4182 Altered mental status, unspecified: Secondary | ICD-10-CM

## 2017-02-27 DIAGNOSIS — I5032 Chronic diastolic (congestive) heart failure: Secondary | ICD-10-CM | POA: Diagnosis not present

## 2017-02-27 DIAGNOSIS — R9401 Abnormal electroencephalogram [EEG]: Secondary | ICD-10-CM | POA: Diagnosis present

## 2017-02-27 DIAGNOSIS — F0390 Unspecified dementia without behavioral disturbance: Secondary | ICD-10-CM | POA: Diagnosis present

## 2017-02-27 DIAGNOSIS — E876 Hypokalemia: Secondary | ICD-10-CM

## 2017-02-27 DIAGNOSIS — Z7982 Long term (current) use of aspirin: Secondary | ICD-10-CM | POA: Diagnosis not present

## 2017-02-27 DIAGNOSIS — I110 Hypertensive heart disease with heart failure: Secondary | ICD-10-CM | POA: Diagnosis present

## 2017-02-27 DIAGNOSIS — R29700 NIHSS score 0: Secondary | ICD-10-CM | POA: Diagnosis present

## 2017-02-27 DIAGNOSIS — I6340 Cerebral infarction due to embolism of unspecified cerebral artery: Principal | ICD-10-CM | POA: Diagnosis present

## 2017-02-27 DIAGNOSIS — Z8249 Family history of ischemic heart disease and other diseases of the circulatory system: Secondary | ICD-10-CM | POA: Diagnosis not present

## 2017-02-27 DIAGNOSIS — R410 Disorientation, unspecified: Secondary | ICD-10-CM | POA: Diagnosis not present

## 2017-02-27 DIAGNOSIS — Z23 Encounter for immunization: Secondary | ICD-10-CM | POA: Diagnosis not present

## 2017-02-27 DIAGNOSIS — E1165 Type 2 diabetes mellitus with hyperglycemia: Secondary | ICD-10-CM | POA: Diagnosis present

## 2017-02-27 DIAGNOSIS — E039 Hypothyroidism, unspecified: Secondary | ICD-10-CM | POA: Diagnosis present

## 2017-02-27 DIAGNOSIS — Z7984 Long term (current) use of oral hypoglycemic drugs: Secondary | ICD-10-CM

## 2017-02-27 DIAGNOSIS — R4789 Other speech disturbances: Secondary | ICD-10-CM | POA: Diagnosis not present

## 2017-02-27 DIAGNOSIS — R419 Unspecified symptoms and signs involving cognitive functions and awareness: Secondary | ICD-10-CM | POA: Diagnosis not present

## 2017-02-27 DIAGNOSIS — E785 Hyperlipidemia, unspecified: Secondary | ICD-10-CM | POA: Diagnosis present

## 2017-02-27 DIAGNOSIS — F05 Delirium due to known physiological condition: Secondary | ICD-10-CM | POA: Diagnosis not present

## 2017-02-27 DIAGNOSIS — E119 Type 2 diabetes mellitus without complications: Secondary | ICD-10-CM

## 2017-02-27 DIAGNOSIS — G459 Transient cerebral ischemic attack, unspecified: Secondary | ICD-10-CM | POA: Diagnosis present

## 2017-02-27 DIAGNOSIS — E118 Type 2 diabetes mellitus with unspecified complications: Secondary | ICD-10-CM | POA: Diagnosis not present

## 2017-02-27 DIAGNOSIS — Z823 Family history of stroke: Secondary | ICD-10-CM

## 2017-02-27 DIAGNOSIS — Z79899 Other long term (current) drug therapy: Secondary | ICD-10-CM | POA: Diagnosis not present

## 2017-02-27 DIAGNOSIS — I639 Cerebral infarction, unspecified: Secondary | ICD-10-CM

## 2017-02-27 DIAGNOSIS — I10 Essential (primary) hypertension: Secondary | ICD-10-CM

## 2017-02-27 LAB — COMPREHENSIVE METABOLIC PANEL
ALBUMIN: 3.5 g/dL (ref 3.5–5.0)
ALK PHOS: 111 U/L (ref 38–126)
ALT: 12 U/L — ABNORMAL LOW (ref 14–54)
ANION GAP: 9 (ref 5–15)
AST: 17 U/L (ref 15–41)
BUN: 18 mg/dL (ref 6–20)
CO2: 27 mmol/L (ref 22–32)
Calcium: 8.7 mg/dL — ABNORMAL LOW (ref 8.9–10.3)
Chloride: 103 mmol/L (ref 101–111)
Creatinine, Ser: 0.94 mg/dL (ref 0.44–1.00)
GFR calc Af Amer: >60 mL/min (ref >60)
GFR calc non Af Amer: 56 mL/min — ABNORMAL LOW (ref >60)
GLUCOSE: 236 mg/dL — AB (ref 65–99)
POTASSIUM: 3.7 mmol/L (ref 3.5–5.1)
SODIUM: 139 mmol/L (ref 135–145)
Total Bilirubin: 0.4 mg/dL (ref 0.3–1.2)
Total Protein: 6.8 g/dL (ref 6.5–8.1)

## 2017-02-27 LAB — I-STAT CHEM 8, ED
BUN: 22 mg/dL — AB (ref 6–20)
CHLORIDE: 102 mmol/L (ref 101–111)
Calcium, Ion: 1.09 mmol/L — ABNORMAL LOW (ref 1.15–1.40)
Creatinine, Ser: 0.9 mg/dL (ref 0.44–1.00)
Glucose, Bld: 238 mg/dL — ABNORMAL HIGH (ref 65–99)
HEMATOCRIT: 36 % (ref 36.0–46.0)
Hemoglobin: 12.2 g/dL (ref 12.0–15.0)
POTASSIUM: 4.2 mmol/L (ref 3.5–5.1)
SODIUM: 140 mmol/L (ref 135–145)
TCO2: 31 mmol/L (ref 22–32)

## 2017-02-27 LAB — CBC
HCT: 36 % (ref 36.0–46.0)
Hemoglobin: 12.1 g/dL (ref 12.0–15.0)
MCH: 28.7 pg (ref 26.0–34.0)
MCHC: 33.6 g/dL (ref 30.0–36.0)
MCV: 85.5 fL (ref 78.0–100.0)
PLATELETS: 265 10*3/uL (ref 150–400)
RBC: 4.21 MIL/uL (ref 3.87–5.11)
RDW: 14.2 % (ref 11.5–15.5)
WBC: 7.1 10*3/uL (ref 4.0–10.5)

## 2017-02-27 LAB — URINALYSIS, ROUTINE W REFLEX MICROSCOPIC
BACTERIA UA: NONE SEEN
Bilirubin Urine: NEGATIVE
Glucose, UA: 50 mg/dL — AB
Hgb urine dipstick: NEGATIVE
Ketones, ur: NEGATIVE mg/dL
Leukocytes, UA: NEGATIVE
Nitrite: NEGATIVE
Protein, ur: 100 mg/dL — AB
SPECIFIC GRAVITY, URINE: 1.014 (ref 1.005–1.030)
SQUAMOUS EPITHELIAL / LPF: NONE SEEN
pH: 6 (ref 5.0–8.0)

## 2017-02-27 LAB — RAPID URINE DRUG SCREEN, HOSP PERFORMED
AMPHETAMINES: NOT DETECTED
Barbiturates: NOT DETECTED
Benzodiazepines: NOT DETECTED
COCAINE: NOT DETECTED
OPIATES: NOT DETECTED
TETRAHYDROCANNABINOL: NOT DETECTED

## 2017-02-27 LAB — DIFFERENTIAL
BASOS ABS: 0 10*3/uL (ref 0.0–0.1)
Basophils Relative: 0 %
EOS PCT: 2 %
Eosinophils Absolute: 0.1 10*3/uL (ref 0.0–0.7)
LYMPHS PCT: 31 %
Lymphs Abs: 2.2 10*3/uL (ref 0.7–4.0)
Monocytes Absolute: 0.6 10*3/uL (ref 0.1–1.0)
Monocytes Relative: 9 %
NEUTROS PCT: 58 %
Neutro Abs: 4.2 10*3/uL (ref 1.7–7.7)

## 2017-02-27 LAB — CBG MONITORING, ED: Glucose-Capillary: 257 mg/dL — ABNORMAL HIGH (ref 65–99)

## 2017-02-27 LAB — PROTIME-INR
INR: 0.92
PROTHROMBIN TIME: 12.3 s (ref 11.4–15.2)

## 2017-02-27 LAB — I-STAT TROPONIN, ED: Troponin i, poc: 0.06 ng/mL (ref 0.00–0.08)

## 2017-02-27 LAB — ETHANOL: Alcohol, Ethyl (B): <10 mg/dL (ref <10)

## 2017-02-27 LAB — APTT: APTT: 29 s (ref 24–36)

## 2017-02-27 MED ORDER — ACETAMINOPHEN 650 MG RE SUPP
650.0000 mg | RECTAL | Status: DC | PRN
  Administered 2017-02-27: 650 mg via RECTAL
  Filled 2017-02-27: qty 1

## 2017-02-27 NOTE — ED Provider Notes (Signed)
[facility] DEPT Provider Note   CSN: [id number] Arrival date & time: 02/27/17  1759     History   Chief Complaint Chief Complaint  Patient presents with  . Aphasia    resolved    HPI Brandi Griffin is a 82 y.o. female.  Patient is a 82 year old female with a history of diabetes, hypertension and mild dementia who presents with an episode of slurred speech.  Her daughter who is with her states that they were driving back from [place] and the patient suddenly had slurred speech and nonsensical speech.  This lasted about 30-40 minutes.  She also had some twitching of her right arm.  There is no generalized seizure activity.  She was awake and alert during this event.  She is back to baseline currently.  She had no facial drooping.  No similar issues in the past.  No recent head trauma.  No other recent illnesses.      Past Medical History:  Diagnosis Date  . Cancer ([place])   . Diabetes mellitus without complication ([place])   . Hypertension     Patient Active Problem List   Diagnosis Date Noted  . TIA (transient ischemic attack) 02/27/2017  . Mild dementia 09/11/2016    Past Surgical History:  Procedure Laterality Date  . ABDOMINAL HYSTERECTOMY      OB History    No data available       Home Medications    Prior to Admission medications   Medication Sig Start Date End Date Taking? Authorizing Provider  aspirin 325 MG EC tablet Take 325 mg by mouth daily.   Yes [provider]  atorvastatin (LIPITOR) 40 MG tablet TK 1 T PO QD 12/23/16  Yes [provider]  hydrochlorothiazide (HYDRODIURIL) 25 MG tablet Take 25 mg by mouth daily.   Yes [provider]  lisinopril (PRINIVIL,ZESTRIL) 20 MG tablet Take 20 mg by mouth daily.   Yes [provider]  metFORMIN (GLUCOPHAGE) 1000 MG tablet TK 2 TS PO QD 12/23/16  Yes [provider]  pioglitazone (ACTOS) 30 MG tablet TK 1 T PO QD 06/10/16  Yes  [provider]  BD ULTRA-FINE LANCETS lancets by Other route. Use as instructed    [provider]  donepezil (ARICEPT) 10 MG tablet Take 1/2 tablet daily for 1 month, then increase to 1 tablet daily Patient not taking: Reported on 02/27/2017 09/11/16   Cameron Sprang, MD  Glucose Blood (FREESTYLE LITE TEST VI) by In Vitro route.    [provider]  glucose blood test strip 1 each by Other route as needed for other. Use as instructed    [provider]    Family History Family History  Problem Relation Age of Onset  . Hypertension Mother   . Stroke Mother     Social History Social History  Substance Use Topics  . Smoking status: Never Smoker  . Smokeless tobacco: Never Used  . Alcohol use No     Allergies   Patient has no known allergies.   Review of Systems Review of Systems  Constitutional: Negative for chills, diaphoresis, fatigue and fever.  HENT: Negative for congestion, rhinorrhea and sneezing.   Eyes: Negative.   Respiratory: Negative for cough, chest tightness and shortness of breath.   Cardiovascular: Negative for chest pain and leg swelling.  Gastrointestinal: Negative for abdominal pain, blood in stool, diarrhea, nausea and vomiting.  Genitourinary: Negative for difficulty urinating, flank pain, frequency and hematuria.  Musculoskeletal: Negative for arthralgias and back pain.  Skin: Negative for rash.  Neurological: Positive for tremors and speech difficulty. Negative for dizziness, weakness, numbness and headaches.  Psychiatric/Behavioral: Positive for confusion.     Physical Exam Updated Vital Signs BP (!) 147/119   Pulse 73   Temp 98.9 F (37.2 C)   Resp (!) 21   Ht 5\' 1"  (1.549 m)   Wt 63.5 kg (140 lb)   SpO2 98%   BMI 26.45 kg/m   Physical Exam  Constitutional: She appears well-developed and well-nourished.  HENT:  Head: Normocephalic and atraumatic.  Eyes: Pupils are equal, round, and reactive to light.   Neck: Normal range of motion. Neck supple.  Cardiovascular: Normal rate, regular rhythm and normal heart sounds.   Pulmonary/Chest: Effort normal and breath sounds normal. No respiratory distress. She has no wheezes. She has no rales. She exhibits no tenderness.  Abdominal: Soft. Bowel sounds are normal. There is no tenderness. There is no rebound and no guarding.  Musculoskeletal: Normal range of motion. She exhibits no edema.  Lymphadenopathy:    She has no cervical adenopathy.  Neurological: She is alert.  Patient is alert and oriented to person and place and month.  She did not give the correct year.  5 out of 5 motor strength in all extremities.  Sensation is grossly intact to light touch in all extremities.  No facial drooping.  No neglect.  No visual field deficits.  Skin: Skin is warm and dry. No rash noted.  Psychiatric: She has a normal mood and affect.     ED Treatments / Results  Labs (all labs ordered are listed, but only abnormal results are displayed) Labs Reviewed  COMPREHENSIVE METABOLIC PANEL - Abnormal; Notable for the following:       Result Value   Glucose, Bld 236 (*)    Calcium 8.7 (*)    ALT 12 (*)    GFR calc non Af Amer 56 (*)    All other components within normal limits  URINALYSIS, ROUTINE W REFLEX MICROSCOPIC - Abnormal; Notable for the following:    Glucose, UA 50 (*)    Protein, ur 100 (*)    All other components within normal limits  CBG MONITORING, ED - Abnormal; Notable for the following:    Glucose-Capillary 257 (*)    All other components within normal limits  I-STAT CHEM 8, ED - Abnormal; Notable for the following:    BUN 22 (*)    Glucose, Bld 238 (*)    Calcium, Ion 1.09 (*)    All other components within normal limits  ETHANOL  PROTIME-INR  APTT  CBC  DIFFERENTIAL  RAPID URINE DRUG SCREEN, HOSP PERFORMED  I-STAT TROPONIN, ED    EKG  EKG Interpretation None       Radiology Ct Head Wo Contrast  Result Date:  02/27/2017 CLINICAL DATA:  Confusion EXAM: CT HEAD WITHOUT CONTRAST TECHNIQUE: Contiguous axial images were obtained from the base of the skull through the vertex without intravenous contrast. COMPARISON:  12/03/2016 FINDINGS: Brain: Moderate small vessel ischemic disease of periventricular and subcortical white matter. No large vascular territory infarction, hemorrhage or midline shift. No intra-axial mass nor extra-axial fluid collections. No hydrocephalus. Mild superficial and central atrophy. Vascular: No hyperdense vessel or unexpected calcification. Skull: No fracture or suspicious osseous lesions. Sinuses/Orbits: No acute finding. Other: None. IMPRESSION: 1. Chronic moderate small vessel ischemic disease with stable superficial and central atrophy. 2. No acute intracranial abnormality. Electronically Signed  By: Ashley Royalty M.D.   On: 02/27/2017 19:28    Procedures Procedures (including critical care time)  Medications Ordered in ED Medications  acetaminophen (TYLENOL) suppository 650 mg (650 mg Rectal Given 02/27/17 2334)     Initial Impression / Assessment and Plan / ED Course  I have reviewed the triage vital signs and the nursing notes.  Pertinent labs & imaging results that were available during my care of the patient were reviewed by me and considered in my medical decision making (see chart for details).     Patient is a 82 year old female who presents with an episode of slurred speech.  Her symptoms have resolved.  She currently has no neurologic deficits.  She has some mild confusion which seems to be baseline for her.  Her head CT is negative.  I spoke with Dr. Maudie Mercury with the hospitalist service who will admit the patient for further evaluation.  I also consulted with Dr. Leonel Ramsay with neurology who recommends admission to [facility] for further treatment.  Final Clinical Impressions(s) / ED Diagnoses   Final diagnoses:  TIA (transient ischemic attack)    New  Prescriptions New Prescriptions   No medications on file     Malvin Johns, MD 02/27/17 2335

## 2017-02-27 NOTE — ED Triage Notes (Addendum)
Pt was in the car with her daughter and her daughter noticed that she started to not make any sense and slurring her words. Over the course of 30 mins to an hour, the symptoms resolved and patient is back to baseline. She denies any unilateral weakness or pain. Strong, equal grips, no facial droop noted. A&Ox4. Ambulatory

## 2017-02-27 NOTE — H&P (Signed)
TRH H&P   Patient Demographics:    Brandi Griffin, is a 82 y.o. female  MRN: [id number]   DOB - [birth year]  Admit Date - 02/27/2017  Outpatient Primary MD for the patient is Seward Carol, MD  Referring MD/NP/PA: Malvin Griffin  Outpatient Specialists:     Patient coming from: home  Chief Complaint  Patient presents with  . Aphasia    resolved      HPI:    Brandi Griffin  is a 82 y.o. female, w hypertension, dm2, was driving back w her family from [place], around 3pm and pt had slurred speech, garbled speech which lasted about 30 minutes  . Denies headache, cp, palp, sob, n/v, diarrhea, brbpr, focal weakness, numbness, tingling.   Pt was brought to ED for evaluation. .    In ED,  UDS=> negative ua negative  Wbc 7.1, Hgb 12.1, Plt 265 Na 139, K 3.7, Glucose 236, Bun 18, creatinine 0.94 Ast 17, Alt 12 Trop 0.06   CT brain IMPRESSION: 1. Chronic moderate small vessel ischemic disease with stable superficial and central atrophy. 2. No acute intracranial abnormality.  Pt will be admitted for TIA      Review of systems:    In addition to the HPI above,  No Fever-chills, No Headache, No changes with Vision or hearing, No problems swallowing food or Liquids, No Chest pain, Cough or Shortness of Breath, No Abdominal pain, No Nausea or Vommitting, Bowel movements are regular, No Blood in stool or Urine, No dysuria, No new skin rashes or bruises, No new joints pains-aches,  No new weakness, tingling, numbness in any extremity, No recent weight gain or loss, No polyuria, polydypsia or polyphagia, No significant Mental Stressors.  A full 10 point Review of Systems was done, except as stated above, all other Review of Systems were negative.   With Past History of the following :    Past Medical History:  Diagnosis Date  . Cancer ([place])   . Diabetes  mellitus without complication ([place])   . Hypertension       Past Surgical History:  Procedure Laterality Date  . ABDOMINAL HYSTERECTOMY        Social History:     Social History  Substance Use Topics  . Smoking status: Never Smoker  . Smokeless tobacco: Never Used  . Alcohol use No     Lives - at home  Mobility - walks by self   Family History :     Family History  Problem Relation Age of Onset  . Hypertension Mother   . Stroke Mother       Home Medications:   Prior to Admission medications   Medication Sig Start Date End Date Taking? Authorizing Provider  aspirin 325 MG EC tablet Take 325 mg by mouth daily.   Yes [provider]  atorvastatin (LIPITOR) 40 MG tablet TK 1 T  PO QD 12/23/16  Yes [provider]  hydrochlorothiazide (HYDRODIURIL) 25 MG tablet Take 25 mg by mouth daily.   Yes [provider]  lisinopril (PRINIVIL,ZESTRIL) 20 MG tablet Take 20 mg by mouth daily.   Yes [provider]  metFORMIN (GLUCOPHAGE) 1000 MG tablet TK 2 TS PO QD 12/23/16  Yes [provider]  pioglitazone (ACTOS) 30 MG tablet TK 1 T PO QD 06/10/16  Yes [provider]  BD ULTRA-FINE LANCETS lancets by Other route. Use as instructed    [provider]  donepezil (ARICEPT) 10 MG tablet Take 1/2 tablet daily for 1 month, then increase to 1 tablet daily Patient not taking: Reported on 02/27/2017 09/11/16   Cameron Sprang, MD  Glucose Blood (FREESTYLE LITE TEST VI) by In Vitro route.    [provider]  glucose blood test strip 1 each by Other route as needed for other. Use as instructed    [provider]     Allergies:    No Known Allergies   Physical Exam:   Vitals  Blood pressure (!) 169/115, pulse (!) 59, temperature 98.9 F (37.2 C), resp. rate 15, height 5\' 1"  (1.549 m), weight 63.5 kg (140 lb), SpO2 99 %.   1. General  lying in bed in NAD,    2. Normal affect and insight, Not Suicidal or  Homicidal, Awake Alert, Oriented X 3.  3. No F.N deficits, ALL C.Nerves Intact, Strength 5/5 all 4 extremities, Sensation intact all 4 extremities, Plantars down going.  4. Ears and Eyes appear Normal, Conjunctivae clear, PERRLA. Moist Oral Mucosa.  5. Supple Neck, No JVD, No cervical lymphadenopathy appriciated, No Carotid Bruits.  6. Symmetrical Chest wall movement, Good air movement bilaterally, CTAB.  7. RRR, No Gallops, Rubs or Murmurs, No Parasternal Heave.  8. Positive Bowel Sounds, Abdomen Soft, No tenderness, No organomegaly appriciated,No rebound -guarding or rigidity.  9.  No Cyanosis, Normal Skin Turgor, No Skin Rash or Bruise.  10. Good muscle tone,  joints appear normal , no effusions, Normal ROM.  11. No Palpable Lymph Nodes in Neck or Axillae  No pronator drift    Data Review:    CBC  Recent Labs Lab 02/27/17 2058 02/27/17 2103  WBC 7.1  --   HGB 12.1 12.2  HCT 36.0 36.0  PLT 265  --   MCV 85.5  --   MCH 28.7  --   MCHC 33.6  --   RDW 14.2  --   LYMPHSABS 2.2  --   MONOABS 0.6  --   EOSABS 0.1  --   BASOSABS 0.0  --    ------------------------------------------------------------------------------------------------------------------  Chemistries   Recent Labs Lab 02/27/17 2058 02/27/17 2103  NA 139 140  K 3.7 4.2  CL 103 102  CO2 27  --   GLUCOSE 236* 238*  BUN 18 22*  CREATININE 0.94 0.90  CALCIUM 8.7*  --   AST 17  --   ALT 12*  --   ALKPHOS 111  --   BILITOT 0.4  --    ------------------------------------------------------------------------------------------------------------------ estimated creatinine clearance is 42.6 mL/min (by C-G formula based on SCr of 0.9 mg/dL). ------------------------------------------------------------------------------------------------------------------ No results for input(s): TSH, T4TOTAL, T3FREE, THYROIDAB in the last 72 hours.  Invalid input(s): FREET3  Coagulation profile  Recent Labs Lab  02/27/17 2058  INR 0.92   ------------------------------------------------------------------------------------------------------------------- No results for input(s): DDIMER in the last 72 hours. -------------------------------------------------------------------------------------------------------------------  Cardiac Enzymes No results for input(s): CKMB, TROPONINI, MYOGLOBIN  in the last 168 hours.  Invalid input(s): CK ------------------------------------------------------------------------------------------------------------------ No results found for: BNP   ---------------------------------------------------------------------------------------------------------------  Urinalysis    Component Value Date/Time   COLORURINE YELLOW 02/27/2017 2039   APPEARANCEUR CLEAR 02/27/2017 2039   LABSPEC 1.014 02/27/2017 2039   PHURINE 6.0 02/27/2017 2039   GLUCOSEU 50 (A) 02/27/2017 2039   HGBUR NEGATIVE 02/27/2017 2039   BILIRUBINUR NEGATIVE 02/27/2017 2039   [place] NEGATIVE 02/27/2017 2039   PROTEINUR 100 (A) 02/27/2017 2039   UROBILINOGEN 1.0 01/06/2007 1412   NITRITE NEGATIVE 02/27/2017 2039   LEUKOCYTESUR NEGATIVE 02/27/2017 2039    ----------------------------------------------------------------------------------------------------------------   Imaging Results:    Ct Head Wo Contrast  Result Date: 02/27/2017 CLINICAL DATA:  Confusion EXAM: CT HEAD WITHOUT CONTRAST TECHNIQUE: Contiguous axial images were obtained from the base of the skull through the vertex without intravenous contrast. COMPARISON:  12/03/2016 FINDINGS: Brain: Moderate small vessel ischemic disease of periventricular and subcortical white matter. No large vascular territory infarction, hemorrhage or midline shift. No intra-axial mass nor extra-axial fluid collections. No hydrocephalus. Mild superficial and central atrophy. Vascular: No hyperdense vessel or unexpected calcification. Skull: No fracture or  suspicious osseous lesions. Sinuses/Orbits: No acute finding. Other: None. IMPRESSION: 1. Chronic moderate small vessel ischemic disease with stable superficial and central atrophy. 2. No acute intracranial abnormality. Electronically Signed   By: Ashley Royalty M.D.   On: 02/27/2017 19:28   nsr at 61, LAD, poor R progression   Assessment & Plan:    Principal Problem:   TIA (transient ischemic attack)    TIA Tele MRI/MRA brain Carotid ultrasound Cardiac echo Check hga1c, lipid Aspirin, plavix , Lipitor  Hypertension Cont lisinopril Cont hydrochlorothiazide  Dementia Cont aricept  Dm2 fsbs ac and qhs, ISS Cont actos   DVT Prophylaxis  - SCDs   AM Labs Ordered, also please review Full Orders  Family Communication: Admission, patients condition and plan of care including tests being ordered have been discussed with the patient who indicate understanding and agree with the plan and Code Status.  Code Status FULL CODE  Likely DC to  home  Condition GUARDED    Consults called:  Neurology by ED  Admission status: inpatient  Time spent in minutes : 45    Jani Gravel M.D on 02/28/2017 at 12:02 AM  Between 7am to 7pm - Pager - [phone]  . After 7pm go to [email or web address] - password [facility]  [facility] - Office  [phone]

## 2017-02-28 ENCOUNTER — Inpatient Hospital Stay (HOSPITAL_COMMUNITY): Payer: Medicare Other

## 2017-02-28 DIAGNOSIS — G459 Transient cerebral ischemic attack, unspecified: Secondary | ICD-10-CM

## 2017-02-28 DIAGNOSIS — I639 Cerebral infarction, unspecified: Secondary | ICD-10-CM

## 2017-02-28 DIAGNOSIS — R4182 Altered mental status, unspecified: Secondary | ICD-10-CM

## 2017-02-28 DIAGNOSIS — I10 Essential (primary) hypertension: Secondary | ICD-10-CM

## 2017-02-28 LAB — ECHOCARDIOGRAM COMPLETE
Ao-asc: 31 cm
CHL CUP MV DEC (S): 324
CHL CUP PV REG GRAD DIAS: 6 mmHg
CHL CUP TV REG PEAK VELOCITY: 251 cm/s
EWDT: 324 ms
FS: 24 % — AB (ref 28–44)
HEIGHTINCHES: 61 in
IVS/LV PW RATIO, ED: 1
LA ID, A-P, ES: 36 mm
LA diam end sys: 36 mm
LA diam index: 2.16 cm/m2
LA vol A4C: 53.7 ml
LA vol index: 34.5 mL/m2
LAVOL: 57.6 mL
LV e' LATERAL: 6.9 cm/s
LVOT area: 3.14 cm2
LVOTD: 20 mm
MVPKEVEL: 1.1 m/s
PV Reg vel dias: 125 cm/s
PW: 12 mm — AB (ref 0.6–1.1)
RV LATERAL S' VELOCITY: 8.33 cm/s
RV TAPSE: 12.2 mm
TDI e' lateral: 6.9
TDI e' medial: 4.35
TRMAXVEL: 251 cm/s
WEIGHTICAEL: 2240 [oz_av]

## 2017-02-28 LAB — GLUCOSE, CAPILLARY
GLUCOSE-CAPILLARY: 145 mg/dL — AB (ref 65–99)
Glucose-Capillary: 197 mg/dL — ABNORMAL HIGH (ref 65–99)
Glucose-Capillary: 217 mg/dL — ABNORMAL HIGH (ref 65–99)
Glucose-Capillary: 85 mg/dL (ref 65–99)
Glucose-Capillary: 93 mg/dL (ref 65–99)

## 2017-02-28 LAB — BASIC METABOLIC PANEL
Anion gap: 9 (ref 5–15)
BUN: 11 mg/dL (ref 6–20)
CHLORIDE: 106 mmol/L (ref 101–111)
CO2: 23 mmol/L (ref 22–32)
CREATININE: 0.83 mg/dL (ref 0.44–1.00)
Calcium: 9 mg/dL (ref 8.9–10.3)
GFR calc non Af Amer: >60 mL/min (ref >60)
Glucose, Bld: 146 mg/dL — ABNORMAL HIGH (ref 65–99)
Potassium: 3.1 mmol/L — ABNORMAL LOW (ref 3.5–5.1)
Sodium: 138 mmol/L (ref 135–145)

## 2017-02-28 LAB — LIPID PANEL
Cholesterol: 201 mg/dL — ABNORMAL HIGH (ref 0–200)
HDL: 74 mg/dL (ref >40)
LDL CALC: 116 mg/dL — AB (ref 0–99)
Total CHOL/HDL Ratio: 2.7 RATIO
Triglycerides: 56 mg/dL (ref <150)
VLDL: 11 mg/dL (ref 0–40)

## 2017-02-28 LAB — HEMOGLOBIN A1C
HEMOGLOBIN A1C: 8.1 % — AB (ref 4.8–5.6)
Mean Plasma Glucose: 185.77 mg/dL

## 2017-02-28 LAB — MAGNESIUM: Magnesium: 1.7 mg/dL (ref 1.7–2.4)

## 2017-02-28 MED ORDER — LIVING WELL WITH DIABETES BOOK
Freq: Once | Status: AC
  Administered 2017-02-28: 18:00:00
  Filled 2017-02-28: qty 1

## 2017-02-28 MED ORDER — HYDRALAZINE HCL 20 MG/ML IJ SOLN
20.0000 mg | Freq: Once | INTRAMUSCULAR | Status: AC
  Administered 2017-02-28: 20 mg via INTRAVENOUS
  Filled 2017-02-28: qty 1

## 2017-02-28 MED ORDER — ACETAMINOPHEN 325 MG PO TABS
650.0000 mg | ORAL_TABLET | ORAL | Status: DC | PRN
  Administered 2017-02-28: 650 mg via ORAL
  Filled 2017-02-28: qty 2

## 2017-02-28 MED ORDER — POTASSIUM CHLORIDE CRYS ER 20 MEQ PO TBCR
40.0000 meq | EXTENDED_RELEASE_TABLET | Freq: Two times a day (BID) | ORAL | Status: AC
  Filled 2017-02-28: qty 2

## 2017-02-28 MED ORDER — FAMOTIDINE IN NACL 20-0.9 MG/50ML-% IV SOLN
20.0000 mg | INTRAVENOUS | Status: DC
  Administered 2017-02-28: 20 mg via INTRAVENOUS
  Filled 2017-02-28: qty 50

## 2017-02-28 MED ORDER — ACETAMINOPHEN 160 MG/5ML PO SOLN: 650.0000 mg | ORAL | Status: DC | PRN

## 2017-02-28 MED ORDER — DEXTROSE-NACL 5-0.9 % IV SOLN: INTRAVENOUS | Status: DC

## 2017-02-28 MED ORDER — ENOXAPARIN SODIUM 40 MG/0.4ML ~~LOC~~ SOLN
40.0000 mg | SUBCUTANEOUS | Status: DC
  Administered 2017-02-28 – 2017-03-02 (×3): 40 mg via SUBCUTANEOUS
  Filled 2017-02-28 (×3): qty 0.4

## 2017-02-28 MED ORDER — HYDROCHLOROTHIAZIDE 25 MG PO TABS: 25.0000 mg | ORAL_TABLET | Freq: Every day | ORAL | Status: DC

## 2017-02-28 MED ORDER — SODIUM CHLORIDE 0.9 % IV SOLN
INTRAVENOUS | Status: DC
  Administered 2017-02-28: 02:00:00 via INTRAVENOUS

## 2017-02-28 MED ORDER — ACETAMINOPHEN 650 MG RE SUPP: 650.0000 mg | RECTAL | Status: DC | PRN

## 2017-02-28 MED ORDER — PIOGLITAZONE HCL 30 MG PO TABS
30.0000 mg | ORAL_TABLET | Freq: Every day | ORAL | Status: DC
  Filled 2017-02-28: qty 1

## 2017-02-28 MED ORDER — SODIUM CHLORIDE 0.9 % IV SOLN
INTRAVENOUS | Status: DC
  Administered 2017-02-28 – 2017-03-01 (×2): via INTRAVENOUS

## 2017-02-28 MED ORDER — LORAZEPAM 2 MG/ML IJ SOLN
0.5000 mg | Freq: Four times a day (QID) | INTRAMUSCULAR | Status: DC | PRN
  Administered 2017-02-28 (×2): 0.5 mg via INTRAVENOUS
  Filled 2017-02-28 (×2): qty 1

## 2017-02-28 MED ORDER — LISINOPRIL 20 MG PO TABS: 20.0000 mg | ORAL_TABLET | Freq: Every day | ORAL | Status: DC

## 2017-02-28 MED ORDER — ATORVASTATIN CALCIUM 80 MG PO TABS
80.0000 mg | ORAL_TABLET | Freq: Every day | ORAL | Status: DC
  Filled 2017-02-28: qty 1

## 2017-02-28 MED ORDER — ATORVASTATIN CALCIUM 40 MG PO TABS: 40.0000 mg | ORAL_TABLET | Freq: Every day | ORAL | Status: DC

## 2017-02-28 MED ORDER — METFORMIN HCL 500 MG PO TABS: 1000.0000 mg | ORAL_TABLET | Freq: Every day | ORAL | Status: DC

## 2017-02-28 MED ORDER — STROKE: EARLY STAGES OF RECOVERY BOOK
Freq: Once | Status: DC
  Filled 2017-02-28: qty 1

## 2017-02-28 MED ORDER — INFLUENZA VAC SPLIT HIGH-DOSE 0.5 ML IM SUSY
0.5000 mL | PREFILLED_SYRINGE | INTRAMUSCULAR | Status: AC
  Administered 2017-03-01: 0.5 mL via INTRAMUSCULAR
  Filled 2017-02-28: qty 0.5

## 2017-02-28 MED ORDER — CLOPIDOGREL BISULFATE 75 MG PO TABS
75.0000 mg | ORAL_TABLET | Freq: Every day | ORAL | Status: DC
  Administered 2017-02-28 – 2017-03-02 (×3): 75 mg via ORAL
  Filled 2017-02-28 (×3): qty 1

## 2017-02-28 MED ORDER — INSULIN ASPART 100 UNIT/ML ~~LOC~~ SOLN
0.0000 [IU] | Freq: Three times a day (TID) | SUBCUTANEOUS | Status: DC
  Administered 2017-02-28: 1 [IU] via SUBCUTANEOUS
  Administered 2017-02-28: 3 [IU] via SUBCUTANEOUS
  Administered 2017-03-01: 1 [IU] via SUBCUTANEOUS
  Administered 2017-03-01 – 2017-03-02 (×2): 2 [IU] via SUBCUTANEOUS
  Administered 2017-03-02 (×2): 1 [IU] via SUBCUTANEOUS

## 2017-02-28 MED ORDER — ASPIRIN EC 325 MG PO TBEC
325.0000 mg | DELAYED_RELEASE_TABLET | Freq: Every day | ORAL | Status: DC
  Administered 2017-02-28: 325 mg via ORAL
  Filled 2017-02-28: qty 1

## 2017-02-28 MED ORDER — MAGNESIUM SULFATE 2 GM/50ML IV SOLN
2.0000 g | Freq: Once | INTRAVENOUS | Status: AC
Start: 2017-02-28 — End: 2017-02-28
  Administered 2017-02-28: 2 g via INTRAVENOUS
  Filled 2017-02-28: qty 50

## 2017-02-28 NOTE — Progress Notes (Addendum)
Inpatient Diabetes Program Recommendations  AACE/ADA: New Consensus Statement on Inpatient Glycemic Control (2015)  Target Ranges:  Prepandial:   less than 140 mg/dL      Peak postprandial:   less than 180 mg/dL (1-2 hours)      Critically ill patients:  140 - 180 mg/dL   Lab Results  Component Value Date   GLUCAP 217 (H) 02/28/2017   HGBA1C 8.1 (H) 02/28/2017    Review of Glycemic ControlResults for KOI, YARBRO (MRN [id number]) as of 02/28/2017 13:37  Ref. Range 02/27/2017 18:12 02/28/2017 01:47 02/28/2017 11:11  Glucose-Capillary Latest Ref Range: 65 - 99 mg/dL 257 (H) 197 (H) 217 (H)    Diabetes history: Type 2 diabetes Outpatient Diabetes medications: Metformin 1000 mg q AM, Actos 30 mg daily Current orders for Inpatient glycemic control:  Novolog sensitive tid with meals  Inpatient Diabetes Program Recommendations:    Note that blood sugars increased.  Please consider adding Lantus 10 units daily while in the hospital.   A1C indicates that average blood sugars prior to admit were 183 mg/dL.  At this point, patient probably will not require insulin at home.  Ordered patient Living well with diabetes booklet for review of DM education as well.    Thanks, Adah Perl, RN, BC-ADM Inpatient Diabetes Coordinator Pager [phone] ([phone]- spoke briefly with patient's daughter.  She states that her mom was taken off insulin and therefore they stopped checking her blood sugars.  She does consume a lot of sweet tea and daughter states that she could change to using splenda.  We discussed A1C results and importance of follow-up with Dr. Delfina Redwood.  No further needs at this time.

## 2017-02-28 NOTE — Progress Notes (Signed)
OT Cancellation Note  Patient Details Name: Brandi Griffin MRN: [id number] DOB: October 26, 1934   Cancelled Treatment:    Reason Eval/Treat Not Completed: Patient at procedure or test/ unavailable (pt receiving echo, then wants to eat lunch) Will follow.  Malka So 02/28/2017, 1:39 PM  02/28/2017 Nestor Lewandowsky, OTR/L Pager: [phone]

## 2017-02-28 NOTE — Progress Notes (Signed)
EEG completed, results pending. 

## 2017-02-28 NOTE — Procedures (Signed)
ELECTROENCEPHALOGRAM REPORT  Date of Study: 02/28/2017  Patient's Name: Brandi Griffin MRN: [id number] Date of Birth: 08/18/1934  Referring Provider: Dr. Roland Rack  Clinical History: This is an 82 year old woman with aphasia  Medications: acetaminophen (TYLENOL) tablet 650 mg  aspirin EC tablet 325 mg  atorvastatin (LIPITOR) tablet 80 mg  clopidogrel (PLAVIX) tablet 75 mg  enoxaparin (LOVENOX) injection 40 mg  famotidine (PEPCID) IVPB 20 mg premix  insulin aspart (novoLOG) injection 0-9 Units   Technical Summary: A multichannel digital EEG recording measured by the international 10-20 system with electrodes applied with paste and impedances below 5000 ohms performed as portable with EKG monitoring in an awake and restless patient.  Hyperventilation and photic stimulation were not performed.  The digital EEG was referentially recorded, reformatted, and digitally filtered in a variety of bipolar and referential montages for optimal display.   Description: The patient is awake and restless during the recording.  During maximal wakefulness, there is a symmetric, medium voltage 8-8.5 Hz posterior dominant rhythm that attenuates with eye opening. This is admixed with a small amount of diffuse 4-6 Hz theta and occasional diffuse 2-3 Hz delta slowing of the waking background. Sleep is not captured. Hyperventilation and photic stimulation were not performed.  There were no epileptiform discharges or electrographic seizures seen.    EKG lead was unremarkable.  Impression: This awake EEG is abnormal due to mild to moderate diffuse slowing of the waking background.  Clinical Correlation of the above findings indicates diffuse cerebral dysfunction that is non-specific in etiology and can be seen with hypoxic/ischemic injury, toxic/metabolic encephalopathies, neurodegenerative disorders, or medication effect.  The absence of epileptiform discharges does not rule out a clinical diagnosis  of epilepsy.  Clinical correlation is advised.   Ellouise Newer, M.D.

## 2017-02-28 NOTE — Progress Notes (Signed)
STROKE TEAM PROGRESS NOTE   SUBJECTIVE (INTERVAL HISTORY) Her  daughter is at the bedside.  Patient is found laying in bed in NAD Overall she feels her condition is gradually improving.  Voices no new complaints. No new events reported overnight.  Daughter describes the events that brought patient into the hospital. She feels all of her symptoms have resolved at this time. She thinks her mothers dementia symptoms may have worsened over the past few days. She explains that her mother tried Aricept twice for one week each time. She stopped the medication for side effect of agitation.  She says her mother was consistently taking her Lipitor but not always compliant with her ASA.   OBJECTIVE  Recent Labs Lab 02/27/17 1812 02/28/17 0147 02/28/17 1111  GLUCAP 257* 197* 217*    Recent Labs Lab 02/27/17 2058 02/27/17 2103 02/28/17 1248  NA 139 140 138  K 3.7 4.2 3.1*  CL 103 102 106  CO2 27  --  23  GLUCOSE 236* 238* 146*  BUN 18 22* 11  CREATININE 0.94 0.90 0.83  CALCIUM 8.7*  --  9.0  MG  --   --  1.7    Recent Labs Lab 02/27/17 2058  AST 17  ALT 12*  ALKPHOS 111  BILITOT 0.4  PROT 6.8  ALBUMIN 3.5    Recent Labs Lab 02/27/17 2058 02/27/17 2103  WBC 7.1  --   NEUTROABS 4.2  --   HGB 12.1 12.2  HCT 36.0 36.0  MCV 85.5  --   PLT 265  --    No results for input(s): CKTOTAL, CKMB, CKMBINDEX, TROPONINI in the last 168 hours.  Recent Labs  02/27/17 2058  LABPROT 12.3  INR 0.92    Recent Labs  02/27/17 2039  COLORURINE YELLOW  LABSPEC 1.014  PHURINE 6.0  GLUCOSEU 50*  HGBUR NEGATIVE  BILIRUBINUR NEGATIVE  KETONESUR NEGATIVE  PROTEINUR 100*  NITRITE NEGATIVE  LEUKOCYTESUR NEGATIVE       Component Value Date/Time   CHOL 201 (H) 02/28/2017 0420   TRIG 56 02/28/2017 0420   HDL 74 02/28/2017 0420   CHOLHDL 2.7 02/28/2017 0420   VLDL 11 02/28/2017 0420   LDLCALC 116 (H) 02/28/2017 0420   Lab Results  Component Value Date   HGBA1C 8.1 (H)  02/28/2017      Component Value Date/Time   LABOPIA NONE DETECTED 02/27/2017 2039   COCAINSCRNUR NONE DETECTED 02/27/2017 2039   LABBENZ NONE DETECTED 02/27/2017 2039   AMPHETMU NONE DETECTED 02/27/2017 2039   THCU NONE DETECTED 02/27/2017 2039   LABBARB NONE DETECTED 02/27/2017 2039     Recent Labs Lab 02/27/17 2057  ETH <10    IMAGING: I have personally reviewed the radiological images below and agree with the radiology interpretations.  Dg Chest 2 View  Result Date: 02/28/2017 CLINICAL DATA:  Altered mental status. EXAM: CHEST  2 VIEW COMPARISON:  06/11/2008. FINDINGS: Prior median sternotomy. Cardiomegaly. Mild right base atelectasis . No pleural effusion or pneumothorax. No acute bony abnormality . IMPRESSION: 1. Prior median sternotomy. Cardiomegaly. No evidence of overt congestive heart failure. 2. Mild right base atelectasis . Electronically Signed   By: Marcello Moores  Register   On: 02/28/2017 08:00   Ct Head Wo Contrast  Result Date: 02/27/2017 CLINICAL DATA:  Confusion EXAM: CT HEAD WITHOUT CONTRAST TECHNIQUE: Contiguous axial images were obtained from the base of the skull through the vertex without intravenous contrast. COMPARISON:  12/03/2016 FINDINGS: Brain: Moderate small vessel ischemic disease of periventricular  and subcortical white matter. No large vascular territory infarction, hemorrhage or midline shift. No intra-axial mass nor extra-axial fluid collections. No hydrocephalus. Mild superficial and central atrophy. Vascular: No hyperdense vessel or unexpected calcification. Skull: No fracture or suspicious osseous lesions. Sinuses/Orbits: No acute finding. Other: None. IMPRESSION: 1. Chronic moderate small vessel ischemic disease with stable superficial and central atrophy. 2. No acute intracranial abnormality. Electronically Signed   By: Ashley Royalty M.D.   On: 02/27/2017 19:28   Mr Brain Wo Contrast  Result Date: 02/28/2017 CLINICAL DATA:  Acute presentation with speech  disturbance. EXAM: MRI HEAD WITHOUT CONTRAST MRA HEAD WITHOUT CONTRAST TECHNIQUE: Multiplanar, multiecho pulse sequences of the brain and surrounding structures were obtained without intravenous contrast. Angiographic images of the head were obtained using MRA technique without contrast. COMPARISON:  CT yesterday.  MRI 10/29/2016. FINDINGS: MRI HEAD FINDINGS Brain: There are punctate acute infarctions seen in the inferior right cerebellum, right occipital lobe, left thalamus, and left frontal lobe, consistent with micro embolic infarctions from a cardiac or ascending aortic source. No large vessel territory infarction. Chronic small-vessel ischemic changes seen throughout the pons, cerebellum, thalami, basal ganglia and cerebral hemispheric white matter. No mass lesion, hemorrhage, hydrocephalus or extra-axial collection. Vascular: Major vessels at the base of the brain show flow. Skull and upper cervical spine: Negative Sinuses/Orbits: Clear/normal Other: None MRA HEAD FINDINGS Both internal carotid arteries widely patent through the skullbase and siphon regions. The anterior and middle cerebral vessels are patent without proximal stenosis, aneurysm or vascular malformation. Distal branch vessels show some atherosclerotic irregularity. Both vertebral arteries are widely patent to the basilar. No basilar stenosis. Posterior circulation branch vessels are patent. Distal branch vessels show some atherosclerotic irregularity. IMPRESSION: Scattered punctate acute infarctions in multiple vascular distributions consistent with micro embolic infarctions from a cardiac or ascending aortic source. These are located in the right cerebellum, right occipital lobe, left thalamus and left frontal lobe. No large infarction. Extensive chronic small-vessel ischemic changes elsewhere throughout the brain. No large or medium vessel occlusion or proximal stenosis. Distal vessel atherosclerotic irregularity. Electronically Signed   By:  Nelson Chimes M.D.   On: 02/28/2017 08:10   Mr Jodene Nam Head Wo Contrast  Result Date: 02/28/2017 CLINICAL DATA:  Acute presentation with speech disturbance. EXAM: MRI HEAD WITHOUT CONTRAST MRA HEAD WITHOUT CONTRAST TECHNIQUE: Multiplanar, multiecho pulse sequences of the brain and surrounding structures were obtained without intravenous contrast. Angiographic images of the head were obtained using MRA technique without contrast. COMPARISON:  CT yesterday.  MRI 10/29/2016. FINDINGS: MRI HEAD FINDINGS Brain: There are punctate acute infarctions seen in the inferior right cerebellum, right occipital lobe, left thalamus, and left frontal lobe, consistent with micro embolic infarctions from a cardiac or ascending aortic source. No large vessel territory infarction. Chronic small-vessel ischemic changes seen throughout the pons, cerebellum, thalami, basal ganglia and cerebral hemispheric white matter. No mass lesion, hemorrhage, hydrocephalus or extra-axial collection. Vascular: Major vessels at the base of the brain show flow. Skull and upper cervical spine: Negative Sinuses/Orbits: Clear/normal Other: None MRA HEAD FINDINGS Both internal carotid arteries widely patent through the skullbase and siphon regions. The anterior and middle cerebral vessels are patent without proximal stenosis, aneurysm or vascular malformation. Distal branch vessels show some atherosclerotic irregularity. Both vertebral arteries are widely patent to the basilar. No basilar stenosis. Posterior circulation branch vessels are patent. Distal branch vessels show some atherosclerotic irregularity. IMPRESSION: Scattered punctate acute infarctions in multiple vascular distributions consistent with micro embolic infarctions from a cardiac  or ascending aortic source. These are located in the right cerebellum, right occipital lobe, left thalamus and left frontal lobe. No large infarction. Extensive chronic small-vessel ischemic changes elsewhere  throughout the brain. No large or medium vessel occlusion or proximal stenosis. Distal vessel atherosclerotic irregularity. Electronically Signed   By: Nelson Chimes M.D.   On: 02/28/2017 08:10     EEG: Date of Service: 02/28/2017 12:39 PM       Impression: This awake EEG is abnormal due to mild to moderate diffuse slowing of the waking background.There were no epileptiform discharges or electrographic seizures seen.  Findings indicates diffuse cerebral dysfunction that is non-specific in etiology and can be seen with hypoxic/ischemic injury, toxic/metabolic encephalopathies, neurodegenerative disorders, or medication effect.  The absence of epileptiform discharges does not rule out a clinical diagnosis of epilepsy                               PHYSICAL EXAM Temp:  [97.9 F (36.6 C)-98.9 F (37.2 C)] 98.4 F (36.9 C) (11/02 1121) Pulse Rate:  [59-83] 72 (11/02 1121) Resp:  [15-24] 18 (11/02 0624) BP: (136-216)/(50-119) 194/50 (11/02 1121) SpO2:  [97 %-100 %] 100 % (11/02 1121) Weight:  [63.5 kg (140 lb)] 63.5 kg (140 lb) (11/01 1805)  General - Well nourished, well developed, in no apparent distress Respiratory - No labored breathing noted on exam  Cardiovascular - Regular rate and rhythm  Neuro: Mental Status: Patient is awake, alert, oriented to person, place, month,  Patient is unable to give a clear and coherent history. No signs of aphasia or neglect Cranial Nerves: II: Visual Fields are full. Pupils are equal, round, and reactive to light.   III,IV, VI: EOMI without ptosis or diploplia.  V: Facial sensation is symmetric to temperature VII: Facial movement is symmetric.  VIII: hearing is intact to voice X: Uvula elevates symmetrically XI: Shoulder shrug is symmetric. XII: tongue is midline without atrophy or fasciculations.  Motor: Tone is normal. Bulk is normal. 5/5 strength was present in all four extremities.  Sensory: Sensation is symmetric to light touch and  temperature in the arms and legs. Cerebellar: FNF and HKS are intact bilaterally  ASSESSMENT AND PLAN: Ms. SHEVAUN LOVAN is a 82 y.o. female with PMH dementia, DM, HTN  admitted for two transient episodes of Aphasia. Her daughter also describes that she was moving her arm abnormally was flinching, but none clearly twitching or doing any type of rhythmic movement.  Scattered punctate acute infarctions in multiple vascular distributions consistent with micro embolic infarctions from a cardiac or ascending aortic source.  Right cerebellum, Right occipital lobe,  Left thalamus and Left frontal lobe.    Resultant:    All symptoms have resolve on exam today, Dementia at baseline.    2D Echo:                             PENDING  B/L Carotid Doppler:         PENDING    VTE prophylaxis: SCD's   Diet Carb Modified Fluid consistency: Thin; Room service appropriate? Yes    aspirin 325 mg daily prior to admission, now on clopidogrel 75 mg daily.     Please discharge patient on Plavix 75 mg daily  .   Patient counseled to be compliant with her antithrombotic medications        Therapy recommendations:  Outpatient Therapies       Disposition: HOME         Recommendations:    If ECHO and Carotids are negative please schedule Outpatient TEE and Loop Recorder at [place]  Ongoing aggressive stroke risk factor management        Follow up Appointments:    PCP follow up   [facility] up with [facility], Dr Leonie Man in 6 weeks or Dr Delice [facility]ch of [facility] for D[facility]. Patient already has an appointment scheduled for March 14, 2017  R/O AFIB:  Please schedule Outpatient TEE and Loop Recorder with [facility]  Diabetes:  HgbA1c   8.1    goal < 7.0  Controlled  Currently on Novolog  CBG monitoring and SSI  DM education and close PCP follow up  Hypertension: Stable Permissive hypertension (OK if <220/120) for 24-48 hours post stroke and then  gradually normalized within 5-7 days.  Long term BP goal normotensive. May slowly restart home B/P medications after 48 hours with close PCP follow up.  Hyperlipidemia:  Home meds:  Lipitor 40mg   LDL  116    , goal < 70  Now on  Lipitor to 80 mg daily  Continue statin at discharge  Other Stroke Risk Factors:  Advanced age  Other Active Problems:  Dementia - likely decompensated from [place], Acute CVA's  Hypothyroidism  Hospital day # 1   Renie Ora Stroke Neurology Team 02/28/2017 3:36 PM I have personally examined this patient, reviewed notes, independently viewed imaging studies, participated in medical decision making and plan of care.ROS completed by me personally and pertinent positives fully documented  I have made any additions or clarifications directly to the above note. Agree with note above.  She has presented with transient symptoms and MRI shows tiny bilateral embolic infarcts likely from central source with strong suspicion for atrial fibrillation. Patient may not be a good long-term anticoagulation candidate given her dementia but may schedule outpatient workup for the same. Recommend change aspirin to Plavix and increase Lipitor dose to 80 mg daily. Aggressive risk factor modification. Follow-up as an outpatient with her neurologist Dr. Delice Lesch. Greater than 50% time during this 35 minute visit was spent on counseling and coordination of care about her embolic strokes, dementia, discussion about medication and treatment options and answering questions.  Antony Contras, MD Medical Director [facility] Pager: [phone] 02/28/2017 4:26 PM  Neurology to delist at this time. Please call with any further questions or concerns. Thank you for this consultation.  To contact Stroke Continuity provider, please refer to [email or web address] After hours, contact General Neurology

## 2017-02-28 NOTE — Progress Notes (Signed)
Patient arrived from [place] via Insurance underwriter.  Patient is alert and verbal on arrival.  Skin assessment done with Leonides Sake, RN. Patients skin is intact , warm and dry.  Lungs are clear to auscultation.  Neuro checks done per order.  Patient is able to walk without assist.  Patient is oriented to the room and the unit.  Family member came in to join the patient in the room.  No acute distress noted on patient at this time. Will continue to monitor.

## 2017-02-28 NOTE — Evaluation (Signed)
Speech Language Pathology Evaluation Patient Details Name: Brandi Griffin MRN: [id number] DOB: Feb 19, 1935 Today's Date: 02/28/2017 Time: 1100-1117 SLP Time Calculation (min) (ACUTE ONLY): 17 min  Problem List:  Patient Active Problem List   Diagnosis Date Noted  . 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 Essential hypertension   . TIA (transient ischemic attack) 02/27/2017  . Mild dementia 09/11/2016   Past Medical History:  Past Medical History:  Diagnosis Date  . Cancer ([place])   . Diabetes mellitus without complication ([place])   . Hypertension    Past Surgical History:  Past Surgical History:  Procedure Laterality Date  . ABDOMINAL HYSTERECTOMY     HPI:  PATTIANN Griffin is a 82 y.o. female with a history of what sounds like moderate dementia who presents with an episode of unintelligible speech.  She was riding in the car with her daughter when she abruptly started speaking "gibberish."  The sounds that came out of her mouth were not formed words.  It is unclear if the patient understood her daughter during this period or not.  MRI shows scattered puntate infarcts in left thalmus, left frontal lobe, right cerebellum and right occipital lobe.    Assessment / Plan / Recommendation Clinical Impression  Pt demonstrates memory impairment consistent with baseline dementia. Pt able to communicate at conversation level fluently comprehend complex language, but was noted to have one instance of semantic paraphasia in conversation. Otherwise naming tasks WNL. Discussed finding with daughter, but given very minimal deficit if any would not recommend f/u with SLP unless deficts are more pronounced with resumption of ADLS. Pt does have total assist for complex tasks at baseline. Will sign off.     SLP Assessment  SLP Recommendation/Assessment: Patient needs continued Speech Lanaguage Pathology Services SLP Visit Diagnosis: Aphasia (R47.01)    Follow Up Recommendations  None    Frequency and  Duration           SLP Evaluation Cognition  Overall Cognitive Status: (P) History of cognitive impairments - at baseline Arousal/Alertness: Awake/alert Orientation Level: Oriented to person;Oriented to place;Oriented to situation Attention: Focused;Sustained;Selective Focused Attention: Appears intact Sustained Attention: Appears intact Selective Attention: Impaired Selective Attention Impairment: Verbal complex;Functional complex Memory: Impaired Memory Impairment: Storage deficit;Retrieval deficit Awareness: Impaired Awareness Impairment: Emergent impairment Problem Solving: Impaired Problem Solving Impairment: Verbal complex Safety/Judgment: Appears intact       Comprehension  Auditory Comprehension Overall Auditory Comprehension: Appears within functional limits for tasks assessed    Expression Verbal Expression Overall Verbal Expression: Impaired Initiation: No impairment Automatic Speech: Name;Social Response Level of Generative/Spontaneous Verbalization: Conversation Repetition: No impairment Naming: Impairment Responsive: 76-100% accurate Confrontation: Within functional limits Convergent: 75-100% accurate Divergent: 75-100% accurate Verbal Errors: Semantic paraphasias Pragmatics: No impairment   Oral / Motor  Oral Motor/Sensory Function Overall Oral Motor/Sensory Function: Within functional limits Motor Speech Overall Motor Speech: Appears within functional limits for tasks assessed   GO                   Herbie Baltimore, MA CCC-SLP [phone]  Lynann Beaver 02/28/2017, 11:29 AM

## 2017-02-28 NOTE — Consult Note (Signed)
Neurology Consultation Reason for Consult: Episode of garbled speech Referring Physician: Georges Mouse  CC: Garbled speech  History is obtained from: Family  HPI: Brandi Griffin is a 82 y.o. female with a history of what sounds like moderate dementia who presents with an episode of unintelligible speech.  She was riding in the car with her daughter when she abruptly started speaking "gibberish."  The sounds that came out of her mouth were not formed words.  It is unclear if the patient understood her daughter during this period or not.    The patient is amnestic to this episode, but also amnestic to where they were driving from and amnestic to getting to the hospital.  Her daughter states this is typical for her.  Her daughter describes that she was moving her arm abnormally was flinching, but none clearly twitching or doing any type of rhythmic movement.  ROS: A 14 point ROS was performed and is negative except as noted in the HPI.  Past Medical History:  Diagnosis Date  . Cancer ([place])   . Diabetes mellitus without complication ([place])   . Hypertension      Family History  Problem Relation Age of Onset  . Hypertension Mother   . Stroke Mother      Social History:  reports that she has never smoked. She has never used smokeless tobacco. She reports that she does not drink alcohol or use drugs.   Exam: Current vital signs: BP (!) 216/91 (BP Location: Left Arm)   Pulse 64   Temp 98.4 F (36.9 C) (Oral)   Resp 16   Ht 5\' 1"  (1.549 m)   Wt 63.5 kg (140 lb)   SpO2 100%   BMI 26.45 kg/m  Vital signs in last 24 hours: Temp:  [97.9 F (36.6 C)-98.9 F (37.2 C)] 98.4 F (36.9 C) (11/02 0200) Pulse Rate:  [59-83] 64 (11/02 0200) Resp:  [15-24] 16 (11/02 0200) BP: (136-216)/(67-119) 216/91 (11/02 0200) SpO2:  [97 %-100 %] 100 % (11/02 0200) Weight:  [63.5 kg (140 lb)] 63.5 kg (140 lb) (11/01 1805)   Physical Exam  Constitutional: Appears well-developed and well-nourished.   Psych: Affect appropriate to situation Eyes: No scleral injection HENT: No OP obstrucion Head: Normocephalic.  Cardiovascular: Normal rate and regular rhythm.  Respiratory: Effort normal and breath sounds normal to anterior ascultation GI: Soft.  No distension. There is no tenderness.  Skin: WDI  Neuro: Mental Status: Patient is awake, alert, oriented to person, place, month, if years 2001 Patient is able to give a clear and coherent history. No signs of aphasia or neglect Cranial Nerves: II: Visual Fields are full. Pupils are equal, round, and reactive to light.   III,IV, VI: EOMI without ptosis or diploplia.  V: Facial sensation is symmetric to temperature VII: Facial movement is symmetric.  VIII: hearing is intact to voice X: Uvula elevates symmetrically XI: Shoulder shrug is symmetric. XII: tongue is midline without atrophy or fasciculations.  Motor: Tone is normal. Bulk is normal. 5/5 strength was present in all four extremities.  Sensory: Sensation is symmetric to light touch and temperature in the arms and legs. Cerebellar: FNF and HKS are intact bilaterally  I have reviewed labs in epic and the results pertinent to this consultation are: CMP-elevated glucose  I have reviewed the images obtained: CT-negative  Impression: 82 year old female with what sounds like transient aphasia.  The description of her movements of her right arm could be consistent with someone moving it because  it was feeling numb or with many other causes.  He does not clearly sound like seizure activity, however I do think an EEG would be reasonable.  I think more likely, however, this represents TIA with transient aphasia and would favor working it up as such for now.  Recommendations: 1. HgbA1c, fasting lipid panel 2. MRI, MRA  of the brain without contrast 3. Frequent neuro checks 4. Echocardiogram 5. Carotid dopplers 6. Prophylactic therapy-Antiplatelet med: Aspirin - dose 325mg  PO or  300mg  PR 7. Risk factor modification 8. Telemetry monitoring 9. EEG 10. please page stroke NP  Or  PA  Or MD  from 8am -4 pm as this patient will be followed by the stroke team at this point.   You can look them up on [email or web address]      Roland Rack, MD [facility] [phone]  If 7pm- 7am, please page neurology on call as listed in [place].

## 2017-02-28 NOTE — Evaluation (Signed)
SLP Cancellation Note  Patient Details Name: Brandi Griffin MRN: [id number] DOB: 12/20/1934   Cancelled treatment:       Reason Eval/Treat Not Completed: Other (comment) (2nd attempt to see pt for swallow eval, off floor)   Claudie Fisherman, [place] [facility] SLP [phone]

## 2017-02-28 NOTE — Evaluation (Signed)
Clinical/Bedside Swallow Evaluation Patient Details  Name: Brandi Griffin MRN: [id number] Date of Birth: 04/05/1935  Today's Date: 02/28/2017 Time: SLP Start Time (ACUTE ONLY): 1100 SLP Stop Time (ACUTE ONLY): 1108 SLP Time Calculation (min) (ACUTE ONLY): 8 min  Past Medical History:  Past Medical History:  Diagnosis Date  . Cancer ([place])   . Diabetes mellitus without complication ([place])   . Hypertension    Past Surgical History:  Past Surgical History:  Procedure Laterality Date  . ABDOMINAL HYSTERECTOMY     HPI:  Brandi Griffin is a 82 y.o. female with a history of what sounds like moderate dementia who presents with an episode of unintelligible speech.  She was riding in the car with her daughter when she abruptly started speaking "gibberish."  The sounds that came out of her mouth were not formed words.  It is unclear if the patient understood her daughter during this period or not.  MRI shows scattered puntate infarcts in left thalmus, left frontal lobe, right cerebellum and right occipital lobe.    Assessment / Plan / Recommendation Clinical Impression  Pt demonstrates normal swallow function. Will resume regular diet and thin liquids. No SLP f/u needed for swallowing.       Aspiration Risk  Mild aspiration risk    Diet Recommendation Regular;Thin liquid   Liquid Administration via: Cup;Straw Medication Administration: Whole meds with liquid Supervision: Patient able to self feed Postural Changes: Seated upright at 90 degrees    Other  Recommendations Oral Care Recommendations: Patient independent with oral care   Follow up Recommendations None      Frequency and Duration            Prognosis        Swallow Study   General HPI: Brandi Griffin is a 82 y.o. female with a history of what sounds like moderate dementia who presents with an episode of unintelligible speech.  She was riding in the car with her daughter when she abruptly started speaking  "gibberish."  The sounds that came out of her mouth were not formed words.  It is unclear if the patient understood her daughter during this period or not.  MRI shows scattered puntate infarcts in left thalmus, left frontal lobe, right cerebellum and right occipital lobe.  Type of Study: Bedside Swallow Evaluation Diet Prior to this Study: NPO Temperature Spikes Noted: No Respiratory Status: Room air History of Recent Intubation: No Behavior/Cognition: Alert;Cooperative;Pleasant mood Oral Cavity Assessment: Within Functional Limits Oral Care Completed by SLP: Yes Oral Cavity - Dentition: Adequate natural dentition Vision: Functional for self-feeding Self-Feeding Abilities: Able to feed self Patient Positioning: Upright in bed Baseline Vocal Quality: Normal Volitional Cough: Strong Volitional Swallow: Able to elicit    Oral/Motor/Sensory Function Overall Oral Motor/Sensory Function: Within functional limits   Ice Chips     Thin Liquid Thin Liquid: Within functional limits    Nectar Thick Nectar Thick Liquid: Not tested   Honey Thick Honey Thick Liquid: Not tested   Puree Puree: Within functional limits   Solid   GO   Solid: Within functional limits       [facility], [place] CCC-SLP [phone]  Lynann Beaver 02/28/2017,11:22 AM

## 2017-02-28 NOTE — Evaluation (Signed)
Physical Therapy Evaluation Patient Details Name: Brandi Griffin MRN: [id number] DOB: [birth year] Today's Date: 02/28/2017   History of Present Illness  pt is an 82 y/o female with pmh significant for HT, DM2 and dementia, admitted for evaluation of sudden onset of slurred and non-sensical speech.  MRI showed scattered punctate acute infarctions--R cerebellum, R occipital, L thalamus and L frontal lobes.  Symptoms resolved outwardly after 30 min.  Clinical Impression  Pt is at or close to baseline functioning and should be safe at home with daughter's assist as before. There are no further acute PT needs as her physical deficits are marginal.  Will sign off at this time.     Follow Up Recommendations No PT follow up    Equipment Recommendations  None recommended by PT    Recommendations for Other Services       Precautions / Restrictions Precautions Precautions: Fall (minimal risk)      Mobility  Bed Mobility Overal bed mobility: Needs Assistance Bed Mobility: Sit to Supine       Sit to supine: Modified independent (Device/Increase time)      Transfers Overall transfer level: Needs assistance   Transfers: Sit to/from Stand Sit to Stand: Supervision         General transfer comment: stays focused within task, no assist needed  Ambulation/Gait Ambulation/Gait assistance: Supervision Ambulation Distance (Feet): 300 Feet Assistive device: None Gait Pattern/deviations: Step-through pattern   Gait velocity interpretation: at or above normal speed for age/gender General Gait Details: pt steady overall and did not show any overt deviations or LOB with moderate balance challenge incl abrupt directional changes and stops, scanning, backing up, stepping over objects and climbing stairs.  Stairs Stairs: Yes Stairs assistance: Supervision Stair Management: One rail Right;Alternating pattern;Forwards Number of Stairs: 3 General stair comments: safe with  rails  Wheelchair Mobility    Modified Rankin (Stroke Patients Only) Modified Rankin (Stroke Patients Only) Pre-Morbid Rankin Score: No symptoms Modified Rankin: No significant disability     Balance Overall balance assessment: Needs assistance   Sitting balance-Leahy Scale: Normal       Standing balance-Leahy Scale: Good                               Pertinent Vitals/Pain Pain Assessment: No/denies pain    Home Living Family/patient expects to be discharged to:: Private residence Living Arrangements: Children Available Help at Discharge: Family;Available 24 hours/day Type of Home: House Home Access: Stairs to enter Entrance Stairs-Rails: Psychiatric nurse of Steps: several Home Layout: Two level Home Equipment: None      Prior Function Level of Independence: Independent;Needs assistance   Gait / Transfers Assistance Needed: independent in home environment without AD.  Daughter not leaving pt alone  ADL's / Homemaking Assistance Needed: pt performs all her basic ADL's by herself        Hand Dominance        Extremity/Trunk Assessment   Upper Extremity Assessment Upper Extremity Assessment: Defer to OT evaluation    Lower Extremity Assessment Lower Extremity Assessment: RLE deficits/detail;LLE deficits/detail RLE Deficits / Details: WFL strength LLE Deficits / Details: hip flexors 4-, quads 4/, hams >4, df/pf 4/5       Communication   Communication: No difficulties  Cognition Arousal/Alertness: Awake/alert Behavior During Therapy: Restless Overall Cognitive Status: History of cognitive impairments - at baseline  General Comments: focus loses focus quickly and needs redirection      General Comments      Exercises     Assessment/Plan    PT Assessment Patent does not need any further PT services  PT Problem List         PT Treatment Interventions      PT Goals  (Current goals can be found in the Care Plan section)  Acute Rehab PT Goals PT Goal Formulation: All assessment and education complete, DC therapy    Frequency     Barriers to discharge        Co-evaluation               AM-PAC PT "6 Clicks" Daily Activity  Outcome Measure Difficulty turning over in bed (including adjusting bedclothes, sheets and blankets)?: None Difficulty moving from lying on back to sitting on the side of the bed? : None Difficulty sitting down on and standing up from a chair with arms (e.g., wheelchair, bedside commode, etc,.)?: None Help needed moving to and from a bed to chair (including a wheelchair)?: None Help needed walking in hospital room?: None Help needed climbing 3-5 steps with a railing? : A Little 6 Click Score: 23    End of Session   Activity Tolerance: Patient tolerated treatment well Patient left: in bed;with call bell/phone within reach;with family/visitor present Nurse Communication: Mobility status PT Visit Diagnosis: Unsteadiness on feet (R26.81);Other (comment) (very mild L LE paresis)    Time: 1000-1022 PT Time Calculation (min) (ACUTE ONLY): 22 min   Charges:   PT Evaluation $PT Eval Low Complexity: 1 Low     PT G Codes:        03/06/17  Donnella Sham, PT [phone]  (pager)  Tessie Fass Starla Deller Mar 06, 2017, 11:37 AM

## 2017-02-28 NOTE — Progress Notes (Signed)
PROGRESS NOTE    Brandi Griffin  DQQ:[id number] DOB: [birth year] DOA: 02/27/2017 PCP: Seward Carol, MD   Brief Narrative: 82 year old female with history of hypertension, type 2 diabetes brought in by patient's daughter for slurred speech. Evaluated by a neurologist and admitted for further evaluation.  Assessment & Plan:   #acute embolic ischemic strokes/ slurred speech.: Multiple vascular distribution located in the right cerebellum, right occipital lobe, left thalamus and left frontal lobe. -MRI MRA finding reviewed with the patient and her daughter at bedside. Patient still having slurred speech. -continue aspirin, Plavix. -LDL elevated to 116, increased the dose of Lipitor to 80 mg. -A1c level 8.1. Patient is currently nothing by mouth. Continue sliding scale. Discontinue metformin and pioglitazone. Patient will likely benefit from insulin on discharge. Diabetic educator consulted. -plan for echocardiogram, carotid ultrasound -PT, OT evaluation -Swallow evaluation, currently nothing by mouth, continue IV fluid. Added Pepcid IV -Likely benefit from rehabilitation.  # hypertension:Holding antihypertensive medications because of acute stroke. Monitor blood pressure.  #type 2 diabetes: Continue insulin sliding scale. Currently nothing by mouth. Monitor blood sugar level. Diabetic educator referral.  DVT prophylaxis: Lovenox subcutaneous  Code Status:full code Family Communication:discussed with the patient's daughter at bedside Disposition Plan:currently admitted, likely discharge to rehabilitation on discharge    Consultants:   neurology  Procedures:MRI Antimicrobials:none  Subjective: Seen and examined at bedside. He is still having slurred speech. Denied headache, malaise, nausea vomiting chest pain shortness of breath. No headache  Objective: Vitals:   02/27/17 2333 02/28/17 0200 02/28/17 0436 02/28/17 0624  BP: (!) 169/115 (!) 216/91 (!) 142/61 (!) 141/65    Pulse: (!) 59 64 81 78  Resp: 15 16  18   Temp:  98.4 F (36.9 C) 98 F (36.7 C) 98.4 F (36.9 C)  TempSrc:  Oral Oral Oral  SpO2: 99% 100% 99% 99%  Weight:      Height:        Intake/Output Summary (Last 24 hours) at 02/28/17 0910 Last data filed at 02/28/17 0700  Gross per 24 hour  Intake              400 ml  Output                0 ml  Net              400 ml   Filed Weights   02/27/17 1805  Weight: 63.5 kg (140 lb)    Examination:  General exam: Appears calm and comfortable , slurry speech Respiratory system: Clear to auscultation. Respiratory effort normal. No wheezing or crackle Cardiovascular system: S1 & S2 heard, RRR.  No pedal edema. Gastrointestinal system: Abdomen is nondistended, soft and nontender. Normal bowel sounds heard. Central nervous system: Alert and oriented. No focal neurological deficits. Extremities: Symmetric 5 x 5 power. Skin: No rashes, lesions or ulcers Psychiatry: Judgement and insight appear normal. Mood & affect appropriate.     Data Reviewed: I have personally reviewed following labs and imaging studies  CBC:  Recent Labs Lab 02/27/17 2058 02/27/17 2103  WBC 7.1  --   NEUTROABS 4.2  --   HGB 12.1 12.2  HCT 36.0 36.0  MCV 85.5  --   PLT 265  --    Basic Metabolic Panel:  Recent Labs Lab 02/27/17 2058 02/27/17 2103  NA 139 140  K 3.7 4.2  CL 103 102  CO2 27  --   GLUCOSE 236* 238*  BUN 18 22*  CREATININE 0.94 0.90  CALCIUM 8.7*  --    GFR: Estimated Creatinine Clearance: 42.6 mL/min (by C-G formula based on SCr of 0.9 mg/dL). Liver Function Tests:  Recent Labs Lab 02/27/17 2058  AST 17  ALT 12*  ALKPHOS 111  BILITOT 0.4  PROT 6.8  ALBUMIN 3.5   No results for input(s): LIPASE, AMYLASE in the last 168 hours. No results for input(s): AMMONIA in the last 168 hours. Coagulation Profile:  Recent Labs Lab 02/27/17 2058  INR 0.92   Cardiac Enzymes: No results for input(s): CKTOTAL, CKMB, CKMBINDEX,  TROPONINI in the last 168 hours. BNP (last 3 results) No results for input(s): PROBNP in the last 8760 hours. HbA1C:  Recent Labs  02/28/17 0420  HGBA1C 8.1*   CBG:  Recent Labs Lab 02/27/17 1812 02/28/17 0147  GLUCAP 257* 197*   Lipid Profile:  Recent Labs  02/28/17 0420  CHOL 201*  HDL 74  LDLCALC 116*  TRIG 56  CHOLHDL 2.7   Thyroid Function Tests: No results for input(s): TSH, T4TOTAL, FREET4, T3FREE, THYROIDAB in the last 72 hours. Anemia Panel: No results for input(s): VITAMINB12, FOLATE, FERRITIN, TIBC, IRON, RETICCTPCT in the last 72 hours. Sepsis Labs: No results for input(s): PROCALCITON, LATICACIDVEN in the last 168 hours.  No results found for this or any previous visit (from the past 240 hour(s)).       Radiology Studies: Dg Chest 2 View  Result Date: 02/28/2017 CLINICAL DATA:  Altered mental status. EXAM: CHEST  2 VIEW COMPARISON:  06/11/2008. FINDINGS: Prior median sternotomy. Cardiomegaly. Mild right base atelectasis . No pleural effusion or pneumothorax. No acute bony abnormality . IMPRESSION: 1. Prior median sternotomy. Cardiomegaly. No evidence of overt congestive heart failure. 2. Mild right base atelectasis . Electronically Signed   By: Marcello Moores  Register   On: 02/28/2017 08:00   Ct Head Wo Contrast  Result Date: 02/27/2017 CLINICAL DATA:  Confusion EXAM: CT HEAD WITHOUT CONTRAST TECHNIQUE: Contiguous axial images were obtained from the base of the skull through the vertex without intravenous contrast. COMPARISON:  12/03/2016 FINDINGS: Brain: Moderate small vessel ischemic disease of periventricular and subcortical white matter. No large vascular territory infarction, hemorrhage or midline shift. No intra-axial mass nor extra-axial fluid collections. No hydrocephalus. Mild superficial and central atrophy. Vascular: No hyperdense vessel or unexpected calcification. Skull: No fracture or suspicious osseous lesions. Sinuses/Orbits: No acute finding.  Other: None. IMPRESSION: 1. Chronic moderate small vessel ischemic disease with stable superficial and central atrophy. 2. No acute intracranial abnormality. Electronically Signed   By: Ashley Royalty M.D.   On: 02/27/2017 19:28   Mr Brain Wo Contrast  Result Date: 02/28/2017 CLINICAL DATA:  Acute presentation with speech disturbance. EXAM: MRI HEAD WITHOUT CONTRAST MRA HEAD WITHOUT CONTRAST TECHNIQUE: Multiplanar, multiecho pulse sequences of the brain and surrounding structures were obtained without intravenous contrast. Angiographic images of the head were obtained using MRA technique without contrast. COMPARISON:  CT yesterday.  MRI 10/29/2016. FINDINGS: MRI HEAD FINDINGS Brain: There are punctate acute infarctions seen in the inferior right cerebellum, right occipital lobe, left thalamus, and left frontal lobe, consistent with micro embolic infarctions from a cardiac or ascending aortic source. No large vessel territory infarction. Chronic small-vessel ischemic changes seen throughout the pons, cerebellum, thalami, basal ganglia and cerebral hemispheric white matter. No mass lesion, hemorrhage, hydrocephalus or extra-axial collection. Vascular: Major vessels at the base of the brain show flow. Skull and upper cervical spine: Negative Sinuses/Orbits: Clear/normal Other: None MRA HEAD FINDINGS Both internal carotid arteries widely patent  through the skullbase and siphon regions. The anterior and middle cerebral vessels are patent without proximal stenosis, aneurysm or vascular malformation. Distal branch vessels show some atherosclerotic irregularity. Both vertebral arteries are widely patent to the basilar. No basilar stenosis. Posterior circulation branch vessels are patent. Distal branch vessels show some atherosclerotic irregularity. IMPRESSION: Scattered punctate acute infarctions in multiple vascular distributions consistent with micro embolic infarctions from a cardiac or ascending aortic source. These  are located in the right cerebellum, right occipital lobe, left thalamus and left frontal lobe. No large infarction. Extensive chronic small-vessel ischemic changes elsewhere throughout the brain. No large or medium vessel occlusion or proximal stenosis. Distal vessel atherosclerotic irregularity. Electronically Signed   By: Nelson Chimes M.D.   On: 02/28/2017 08:10   Mr Jodene Nam Head Wo Contrast  Result Date: 02/28/2017 CLINICAL DATA:  Acute presentation with speech disturbance. EXAM: MRI HEAD WITHOUT CONTRAST MRA HEAD WITHOUT CONTRAST TECHNIQUE: Multiplanar, multiecho pulse sequences of the brain and surrounding structures were obtained without intravenous contrast. Angiographic images of the head were obtained using MRA technique without contrast. COMPARISON:  CT yesterday.  MRI 10/29/2016. FINDINGS: MRI HEAD FINDINGS Brain: There are punctate acute infarctions seen in the inferior right cerebellum, right occipital lobe, left thalamus, and left frontal lobe, consistent with micro embolic infarctions from a cardiac or ascending aortic source. No large vessel territory infarction. Chronic small-vessel ischemic changes seen throughout the pons, cerebellum, thalami, basal ganglia and cerebral hemispheric white matter. No mass lesion, hemorrhage, hydrocephalus or extra-axial collection. Vascular: Major vessels at the base of the brain show flow. Skull and upper cervical spine: Negative Sinuses/Orbits: Clear/normal Other: None MRA HEAD FINDINGS Both internal carotid arteries widely patent through the skullbase and siphon regions. The anterior and middle cerebral vessels are patent without proximal stenosis, aneurysm or vascular malformation. Distal branch vessels show some atherosclerotic irregularity. Both vertebral arteries are widely patent to the basilar. No basilar stenosis. Posterior circulation branch vessels are patent. Distal branch vessels show some atherosclerotic irregularity. IMPRESSION: Scattered punctate  acute infarctions in multiple vascular distributions consistent with micro embolic infarctions from a cardiac or ascending aortic source. These are located in the right cerebellum, right occipital lobe, left thalamus and left frontal lobe. No large infarction. Extensive chronic small-vessel ischemic changes elsewhere throughout the brain. No large or medium vessel occlusion or proximal stenosis. Distal vessel atherosclerotic irregularity. Electronically Signed   By: Nelson Chimes M.D.   On: 02/28/2017 08:10        Scheduled Meds: .  stroke: mapping our early stages of recovery book   Does not apply Once  . aspirin  325 mg Oral Daily  . atorvastatin  40 mg Oral q1800  . clopidogrel  75 mg Oral Daily  . insulin aspart  0-9 Units Subcutaneous TID WC   Continuous Infusions: . sodium chloride    . famotidine (PEPCID) IV       LOS: 1 day    Dron Tanna Furry, MD [facility] Pager [phone]  If 7PM-7AM, please contact night-coverage [email or web address] Password [email or web address] 02/28/2017, 9:10 AM

## 2017-02-28 NOTE — Progress Notes (Signed)
  Echocardiogram 2D Echocardiogram has been performed.  Brandi Griffin G Brandi Griffin 02/28/2017, 1:57 PM

## 2017-02-28 NOTE — Evaluation (Signed)
SLP Cancellation Note  Patient Details Name: Brandi Griffin MRN: [id number] DOB: Sep 17, 1934   Cancelled treatment:       Reason Eval/Treat Not Completed:  (pt off floor, at MRI?, will continue efforts)   Claudie Fisherman, [facility] SLP [phone]

## 2017-02-28 NOTE — Progress Notes (Signed)
Notified MD of 8 beat run of V-Tac.

## 2017-03-01 ENCOUNTER — Encounter (HOSPITAL_COMMUNITY): Payer: Self-pay

## 2017-03-01 DIAGNOSIS — I5032 Chronic diastolic (congestive) heart failure: Secondary | ICD-10-CM

## 2017-03-01 DIAGNOSIS — R410 Disorientation, unspecified: Secondary | ICD-10-CM

## 2017-03-01 DIAGNOSIS — E876 Hypokalemia: Secondary | ICD-10-CM

## 2017-03-01 LAB — GLUCOSE, CAPILLARY
GLUCOSE-CAPILLARY: 103 mg/dL — AB (ref 65–99)
GLUCOSE-CAPILLARY: 142 mg/dL — AB (ref 65–99)
GLUCOSE-CAPILLARY: 184 mg/dL — AB (ref 65–99)
GLUCOSE-CAPILLARY: 186 mg/dL — AB (ref 65–99)
Glucose-Capillary: 87 mg/dL (ref 65–99)
Glucose-Capillary: 160 mg/dL — ABNORMAL HIGH (ref 65–99)

## 2017-03-01 LAB — BASIC METABOLIC PANEL
ANION GAP: 8 (ref 5–15)
BUN: 11 mg/dL (ref 6–20)
CHLORIDE: 111 mmol/L (ref 101–111)
CO2: 22 mmol/L (ref 22–32)
Calcium: 8.3 mg/dL — ABNORMAL LOW (ref 8.9–10.3)
Creatinine, Ser: 0.85 mg/dL (ref 0.44–1.00)
GFR calc Af Amer: >60 mL/min (ref >60)
GFR calc non Af Amer: >60 mL/min (ref >60)
GLUCOSE: 105 mg/dL — AB (ref 65–99)
POTASSIUM: 3.3 mmol/L — AB (ref 3.5–5.1)
Sodium: 141 mmol/L (ref 135–145)

## 2017-03-01 LAB — MAGNESIUM: Magnesium: 2.3 mg/dL (ref 1.7–2.4)

## 2017-03-01 MED ORDER — QUETIAPINE FUMARATE 25 MG PO TABS
25.0000 mg | ORAL_TABLET | Freq: Every day | ORAL | Status: DC
  Administered 2017-03-01 – 2017-03-02 (×2): 25 mg via ORAL
  Filled 2017-03-01 (×2): qty 1

## 2017-03-01 MED ORDER — ATORVASTATIN CALCIUM 80 MG PO TABS
80.0000 mg | ORAL_TABLET | Freq: Every day | ORAL | Status: DC
  Administered 2017-03-01 – 2017-03-02 (×2): 80 mg via ORAL
  Filled 2017-03-01 (×2): qty 1

## 2017-03-01 MED ORDER — LISINOPRIL 20 MG PO TABS
20.0000 mg | ORAL_TABLET | Freq: Every day | ORAL | Status: DC
  Administered 2017-03-01 – 2017-03-02 (×2): 20 mg via ORAL
  Filled 2017-03-01 (×2): qty 1

## 2017-03-01 MED ORDER — POTASSIUM CHLORIDE CRYS ER 20 MEQ PO TBCR
40.0000 meq | EXTENDED_RELEASE_TABLET | ORAL | Status: AC
  Administered 2017-03-01 (×2): 40 meq via ORAL
  Filled 2017-03-01 (×2): qty 2

## 2017-03-01 MED ORDER — POTASSIUM CHLORIDE CRYS ER 20 MEQ PO TBCR: 40.0000 meq | EXTENDED_RELEASE_TABLET | Freq: Every day | ORAL | 0 refills | Status: AC

## 2017-03-01 MED ORDER — CLOPIDOGREL BISULFATE 75 MG PO TABS: 75.0000 mg | ORAL_TABLET | Freq: Every day | ORAL | 0 refills | Status: DC

## 2017-03-01 MED ORDER — ACETAMINOPHEN 325 MG PO TABS: 650.0000 mg | ORAL_TABLET | ORAL | Status: AC | PRN

## 2017-03-01 MED ORDER — HYDRALAZINE HCL 20 MG/ML IJ SOLN: 5.0000 mg | INTRAMUSCULAR | Status: DC | PRN

## 2017-03-01 MED ORDER — ATORVASTATIN CALCIUM 80 MG PO TABS: 80.0000 mg | ORAL_TABLET | Freq: Every day | ORAL | 0 refills | Status: AC

## 2017-03-01 NOTE — Progress Notes (Signed)
Pt agitated refusing vital signs, refusing medications. Removed telemetry and refuses to allow it to be put back on.  Pt attempting to get up and get dressed. States "I have to go to choir practice" Combative with daughter with attempts to redirect patient. PRN ativan given.

## 2017-03-01 NOTE — Progress Notes (Signed)
RN called by nurse tech BP 191/68 RN took BP manually 170/82 no complaints of chest pain, dizziness or head ache MD notified will continue to monitor

## 2017-03-01 NOTE — Discharge Instructions (Signed)

## 2017-03-01 NOTE — Evaluation (Signed)
Occupational Therapy Evaluation and Discharge Patient Details Name: Brandi Griffin MRN: [id number] DOB: 10-Jun-1934 Today's Date: 03/01/2017    History of Present Illness pt is an 82 y/o female with pmh significant for HT, DM2 and dementia, admitted for evaluation of sudden onset of slurred and non-sensical speech.  MRI showed scattered punctate acute infarctions--R cerebellum, R occipital, L thalamus and L frontal lobes.  Symptoms resolved outwardly after 30 min.   Clinical Impression   Pt currently supervision for ADL and mobility in room due to baseline cognition deficits. Pt at baseline, confirmed by daughter for ADL and functional transfers. No further OT needs, daughter will be providing 24 hour supervision upon dc. OT to sign off at this time. Thank you for the opportunity to serve this patient.    Follow Up Recommendations  Supervision/Assistance - 24 hour    Equipment Recommendations  None recommended by OT    Recommendations for Other Services       Precautions / Restrictions Precautions Precautions: Fall (minimal risk)      Mobility Bed Mobility Overal bed mobility: Needs Assistance Bed Mobility: Sit to Supine       Sit to supine: Modified independent (Device/Increase time)      Transfers Overall transfer level: Needs assistance   Transfers: Sit to/from Stand Sit to Stand: Supervision         General transfer comment: stays focused within task, no assist needed    Balance Overall balance assessment: Needs assistance Sitting-balance support: No upper extremity supported;Feet supported Sitting balance-Leahy Scale: Normal Sitting balance - Comments: sitting EOB with no back support   Standing balance support: No upper extremity supported;During functional activity Standing balance-Leahy Scale: Good Standing balance comment: good in standing and dynamic movement                           ADL either performed or assessed with clinical  judgement   ADL Overall ADL's : Independent                                       General ADL Comments: able to perform toilet transfer, peri care, LB dressing, sink level grooming without any assist needed     Vision Patient Visual Report: No change from baseline Additional Comments:  (Pt able to read from stroke book, read clock/TV)     Perception     Praxis      Pertinent Vitals/Pain Pain Assessment: No/denies pain     Hand Dominance     Extremity/Trunk Assessment Upper Extremity Assessment Upper Extremity Assessment: Overall WFL for tasks assessed   Lower Extremity Assessment RLE Deficits / Details: WFL strength LLE Deficits / Details: hip flexors 4-, quads 4/, hams >4, df/pf 4/5       Communication Communication Communication: No difficulties   Cognition Arousal/Alertness: Awake/alert Behavior During Therapy: Restless Overall Cognitive Status: History of cognitive impairments - at baseline                                 General Comments: focus loses focus quickly and needs redirection; short term memory deficits   General Comments  Daughter asleep in recliner, assisted with history    Exercises     Shoulder Instructions      Home Living Family/patient expects to be discharged to::  Private residence Living Arrangements: Children Available Help at Discharge: Family;Available 24 hours/day Type of Home: House Home Access: Stairs to enter [employer] of Steps: several Entrance Stairs-Rails: Right;Left Home Layout: Two level Alternate Level Stairs-Number of Steps: flight Alternate Level Stairs-Rails: Right;Left Bathroom Shower/Tub: Teacher, early years/pre: Standard     Home Equipment: None      Lives With: Daughter    Prior Functioning/Environment Level of Independence: Independent;Needs assistance  Gait / Transfers Assistance Needed: independent in home environment without AD.  Daughter not  leaving pt alone ADL's / Homemaking Assistance Needed: pt performs all her basic ADL's by herself            OT Problem List:        OT Treatment/Interventions:      OT Goals(Current goals can be found in the care plan section) Acute Rehab OT Goals Patient Stated Goal: to get home OT Goal Formulation: With patient Time For Goal Achievement: 03/15/17 Potential to Achieve Goals: Good  OT Frequency:     Barriers to D/C:            Co-evaluation              AM-PAC PT "6 Clicks" Daily Activity     Outcome Measure Help from another person eating meals?: None Help from another person taking care of personal grooming?: None Help from another person toileting, which includes using toliet, bedpan, or urinal?: None Help from another person bathing (including washing, rinsing, drying)?: None Help from another person to put on and taking off regular upper body clothing?: None Help from another person to put on and taking off regular lower body clothing?: None 6 Click Score: 24   End of Session Nurse Communication: Mobility status  Activity Tolerance: Patient tolerated treatment well Patient left: in bed;with call bell/phone within reach;with bed alarm set;with family/visitor present                   Time: 7858-8502 OT Time Calculation (min): 16 min Charges:  OT General Charges $OT Visit: 1 Visit OT Evaluation $OT Eval Low Complexity: 1 Low G-Codes:     Brandi Griffin [phone]  Brandi Griffin 03/01/2017, 11:34 AM

## 2017-03-01 NOTE — Progress Notes (Signed)
Pt resting in bed with eyes closed. Respirations even and unlabored. Daughter at bedside.

## 2017-03-01 NOTE — Progress Notes (Signed)
PROGRESS NOTE    Brandi Griffin   ERX:[id number]  DOB: 10/23/1934  DOA: 02/27/2017 PCP: Seward Carol, MD   Brief Narrative:  Brandi Griffin 82 year old female with history of hypertension, type 2 diabetes brought in by patient's daughter for slurred speech. Evaluated by a neurologist and admitted for further evaluation.   Subjective: Patient has no complaints. Speech has returned to normal and she is anxious to go home ROS: no complaints of nausea, vomiting, constipation diarrhea, cough, dyspnea or dysuria. No other complaints.   Assessment & Plan:   Principal Problem:  acute embolic ischemic strokes/ slurred speech - Multiple vascular distribution located in the right cerebellum, right occipital lobe, left thalamus and left frontal lobe. -MRI MRA finding reviewed with the patient and her daughter at bedside. Patient still having slurred speech. -LDL elevated to 116, increased the dose of Lipitor to 80 mg. -A1c level 8.1.   -  Echocardiogram completed and does not reveal a thrombus -  carotid ultrasound pending -PT, OT evaluation- home with home health  Grade 2 dCHF - chronic- needs aggressive BP control   Acute delirium - sundown beginning yesterday - follow- add Seroquel for tonight.    Hypertension - resume Lisinopril today - hold HCTZ    Hypokalemia - replacing  Type 2 diabetes -  Continue insulin sliding scale.      DVT prophylaxis: Lovenox Code Status: full code Family Communication: daughter Disposition Plan:  Consultants:   neuro Procedures:   ECHO Study Conclusions  - Left ventricle: The cavity size was normal. Wall thickness was   increased in a pattern of mild LVH. Systolic function was normal.   The estimated ejection fraction was in the range of 60% to 65%.   Wall motion was normal; there were no regional wall motion   abnormalities. Features are consistent with a pseudonormal left   ventricular filling pattern, with  concomitant abnormal relaxation   and increased filling pressure (grade 2 diastolic dysfunction). - Aortic valve: There was no stenosis. - Mitral valve: Moderately calcified annulus. There was trivial   regurgitation. - Left atrium: The atrium was mildly dilated. - Right ventricle: The cavity size was normal. Systolic function   was mildly reduced. - Tricuspid valve: Peak RV-RA gradient (S): 25 mm Hg. - Pulmonary arteries: PA peak pressure: 28 mm Hg (S). - Inferior vena cava: The vessel was normal in size. The   respirophasic diameter changes were in the normal range (= 50%),   consistent with normal central venous pressure. Antimicrobials:  Anti-infectives    None       Objective: Vitals:   03/01/17 0358 03/01/17 0929 03/01/17 1352 03/01/17 1408  BP: (!) 185/66 (!) 142/82 (!) 191/68 (!) 170/82  Pulse: 97  82   Resp: 20  17   Temp: (!) 97.2 F (36.2 C)  98 F (36.7 C)   TempSrc: Oral  Oral   SpO2: 99%  100%   Weight: 58.1 kg (128 lb 1.6 oz)     Height:        Intake/Output Summary (Last 24 hours) at 03/01/17 1653 Last data filed at 02/28/17 1800  Gross per 24 hour  Intake           723.33 ml  Output                0 ml  Net           723.33 ml   Filed Weights   02/27/17 1805 03/01/17 0358  Weight: 63.5 kg (140 lb) 58.1 kg (128 lb 1.6 oz)    Examination: General exam: Appears comfortable  HEENT: PERRLA, oral mucosa moist, no sclera icterus or thrush Respiratory system: Clear to auscultation. Respiratory effort normal. Cardiovascular system: S1 & S2 heard, RRR.  No murmurs  Gastrointestinal system: Abdomen soft, non-tender, nondistended. Normal bowel sound. No organomegaly Central nervous system: Alert and oriented. No focal neurological deficits. Extremities: No cyanosis, clubbing or edema Skin: No rashes or ulcers Psychiatry:  Mood & affect appropriate.     Data Reviewed: I have personally reviewed following labs and imaging studies  CBC:  Recent  Labs Lab 02/27/17 2058 02/27/17 2103  WBC 7.1  --   NEUTROABS 4.2  --   HGB 12.1 12.2  HCT 36.0 36.0  MCV 85.5  --   PLT 265  --    Basic Metabolic Panel:  Recent Labs Lab 02/27/17 2058 02/27/17 2103 02/28/17 1248 03/01/17 0637  NA 139 140 138 141  K 3.7 4.2 3.1* 3.3*  CL 103 102 106 111  CO2 27  --  23 22  GLUCOSE 236* 238* 146* 105*  BUN 18 22* 11 11  CREATININE 0.94 0.90 0.83 0.85  CALCIUM 8.7*  --  9.0 8.3*  MG  --   --  1.7 2.3   GFR: Estimated Creatinine Clearance: 43.3 mL/min (by C-G formula based on SCr of 0.85 mg/dL). Liver Function Tests:  Recent Labs Lab 02/27/17 2058  AST 17  ALT 12*  ALKPHOS 111  BILITOT 0.4  PROT 6.8  ALBUMIN 3.5   No results for input(s): LIPASE, AMYLASE in the last 168 hours. No results for input(s): AMMONIA in the last 168 hours. Coagulation Profile:  Recent Labs Lab 02/27/17 2058  INR 0.92   Cardiac Enzymes: No results for input(s): CKTOTAL, CKMB, CKMBINDEX, TROPONINI in the last 168 hours. BNP (last 3 results) No results for input(s): PROBNP in the last 8760 hours. HbA1C:  Recent Labs  02/28/17 0420  HGBA1C 8.1*   CBG:  Recent Labs Lab 02/28/17 2030 03/01/17 0003 03/01/17 0323 03/01/17 0746 03/01/17 1205  GLUCAP 93 85 87 103* 160*   Lipid Profile:  Recent Labs  02/28/17 0420  CHOL 201*  HDL 74  LDLCALC 116*  TRIG 56  CHOLHDL 2.7   Thyroid Function Tests: No results for input(s): TSH, T4TOTAL, FREET4, T3FREE, THYROIDAB in the last 72 hours. Anemia Panel: No results for input(s): VITAMINB12, FOLATE, FERRITIN, TIBC, IRON, RETICCTPCT in the last 72 hours. Urine analysis:    Component Value Date/Time   COLORURINE YELLOW 02/27/2017 2039   APPEARANCEUR CLEAR 02/27/2017 2039   LABSPEC 1.014 02/27/2017 2039   PHURINE 6.0 02/27/2017 2039   GLUCOSEU 50 (A) 02/27/2017 2039   HGBUR NEGATIVE 02/27/2017 2039   BILIRUBINUR NEGATIVE 02/27/2017 2039   [place] NEGATIVE 02/27/2017 2039   PROTEINUR  100 (A) 02/27/2017 2039   UROBILINOGEN 1.0 01/06/2007 1412   NITRITE NEGATIVE 02/27/2017 2039   LEUKOCYTESUR NEGATIVE 02/27/2017 2039   Sepsis Labs: @LABRCNTIP (procalcitonin:4,lacticidven:4) )No results found for this or any previous visit (from the past 240 hour(s)).       Radiology Studies: Dg Chest 2 View  Result Date: 02/28/2017 CLINICAL DATA:  Altered mental status. EXAM: CHEST  2 VIEW COMPARISON:  06/11/2008. FINDINGS: Prior median sternotomy. Cardiomegaly. Mild right base atelectasis . No pleural effusion or pneumothorax. No acute bony abnormality . IMPRESSION: 1. Prior median sternotomy. Cardiomegaly. No evidence of overt congestive heart failure. 2. Mild right base atelectasis . Electronically  Signed   By: Marcello Moores  Register   On: 02/28/2017 08:00   Ct Head Wo Contrast  Result Date: 02/27/2017 CLINICAL DATA:  Confusion EXAM: CT HEAD WITHOUT CONTRAST TECHNIQUE: Contiguous axial images were obtained from the base of the skull through the vertex without intravenous contrast. COMPARISON:  12/03/2016 FINDINGS: Brain: Moderate small vessel ischemic disease of periventricular and subcortical white matter. No large vascular territory infarction, hemorrhage or midline shift. No intra-axial mass nor extra-axial fluid collections. No hydrocephalus. Mild superficial and central atrophy. Vascular: No hyperdense vessel or unexpected calcification. Skull: No fracture or suspicious osseous lesions. Sinuses/Orbits: No acute finding. Other: None. IMPRESSION: 1. Chronic moderate small vessel ischemic disease with stable superficial and central atrophy. 2. No acute intracranial abnormality. Electronically Signed   By: Ashley Royalty M.D.   On: 02/27/2017 19:28   Mr Brain Wo Contrast  Result Date: 02/28/2017 CLINICAL DATA:  Acute presentation with speech disturbance. EXAM: MRI HEAD WITHOUT CONTRAST MRA HEAD WITHOUT CONTRAST TECHNIQUE: Multiplanar, multiecho pulse sequences of the brain and surrounding  structures were obtained without intravenous contrast. Angiographic images of the head were obtained using MRA technique without contrast. COMPARISON:  CT yesterday.  MRI 10/29/2016. FINDINGS: MRI HEAD FINDINGS Brain: There are punctate acute infarctions seen in the inferior right cerebellum, right occipital lobe, left thalamus, and left frontal lobe, consistent with micro embolic infarctions from a cardiac or ascending aortic source. No large vessel territory infarction. Chronic small-vessel ischemic changes seen throughout the pons, cerebellum, thalami, basal ganglia and cerebral hemispheric white matter. No mass lesion, hemorrhage, hydrocephalus or extra-axial collection. Vascular: Major vessels at the base of the brain show flow. Skull and upper cervical spine: Negative Sinuses/Orbits: Clear/normal Other: None MRA HEAD FINDINGS Both internal carotid arteries widely patent through the skullbase and siphon regions. The anterior and middle cerebral vessels are patent without proximal stenosis, aneurysm or vascular malformation. Distal branch vessels show some atherosclerotic irregularity. Both vertebral arteries are widely patent to the basilar. No basilar stenosis. Posterior circulation branch vessels are patent. Distal branch vessels show some atherosclerotic irregularity. IMPRESSION: Scattered punctate acute infarctions in multiple vascular distributions consistent with micro embolic infarctions from a cardiac or ascending aortic source. These are located in the right cerebellum, right occipital lobe, left thalamus and left frontal lobe. No large infarction. Extensive chronic small-vessel ischemic changes elsewhere throughout the brain. No large or medium vessel occlusion or proximal stenosis. Distal vessel atherosclerotic irregularity. Electronically Signed   By: Nelson Chimes M.D.   On: 02/28/2017 08:10   Mr Jodene Nam Head Wo Contrast  Result Date: 02/28/2017 CLINICAL DATA:  Acute presentation with speech  disturbance. EXAM: MRI HEAD WITHOUT CONTRAST MRA HEAD WITHOUT CONTRAST TECHNIQUE: Multiplanar, multiecho pulse sequences of the brain and surrounding structures were obtained without intravenous contrast. Angiographic images of the head were obtained using MRA technique without contrast. COMPARISON:  CT yesterday.  MRI 10/29/2016. FINDINGS: MRI HEAD FINDINGS Brain: There are punctate acute infarctions seen in the inferior right cerebellum, right occipital lobe, left thalamus, and left frontal lobe, consistent with micro embolic infarctions from a cardiac or ascending aortic source. No large vessel territory infarction. Chronic small-vessel ischemic changes seen throughout the pons, cerebellum, thalami, basal ganglia and cerebral hemispheric white matter. No mass lesion, hemorrhage, hydrocephalus or extra-axial collection. Vascular: Major vessels at the base of the brain show flow. Skull and upper cervical spine: Negative Sinuses/Orbits: Clear/normal Other: None MRA HEAD FINDINGS Both internal carotid arteries widely patent through the skullbase and siphon regions. The anterior and  middle cerebral vessels are patent without proximal stenosis, aneurysm or vascular malformation. Distal branch vessels show some atherosclerotic irregularity. Both vertebral arteries are widely patent to the basilar. No basilar stenosis. Posterior circulation branch vessels are patent. Distal branch vessels show some atherosclerotic irregularity. IMPRESSION: Scattered punctate acute infarctions in multiple vascular distributions consistent with micro embolic infarctions from a cardiac or ascending aortic source. These are located in the right cerebellum, right occipital lobe, left thalamus and left frontal lobe. No large infarction. Extensive chronic small-vessel ischemic changes elsewhere throughout the brain. No large or medium vessel occlusion or proximal stenosis. Distal vessel atherosclerotic irregularity. Electronically Signed   By:  Nelson Chimes M.D.   On: 02/28/2017 08:10      Scheduled Meds: .  stroke: mapping our early stages of recovery book   Does not apply Once  . atorvastatin  80 mg Oral q1800  . clopidogrel  75 mg Oral Daily  . enoxaparin (LOVENOX) injection  40 mg Subcutaneous Q24H  . insulin aspart  0-9 Units Subcutaneous TID WC  . lisinopril  20 mg Oral Daily   Continuous Infusions:   LOS: 2 days    Time spent in minutes: 35    Debbe Odea, MD [facility] Pager: [email or web address] Password [email or web address] 03/01/2017, 4:53 PM

## 2017-03-02 DIAGNOSIS — I5032 Chronic diastolic (congestive) heart failure: Secondary | ICD-10-CM

## 2017-03-02 DIAGNOSIS — E119 Type 2 diabetes mellitus without complications: Secondary | ICD-10-CM

## 2017-03-02 DIAGNOSIS — E876 Hypokalemia: Secondary | ICD-10-CM

## 2017-03-02 DIAGNOSIS — F05 Delirium due to known physiological condition: Secondary | ICD-10-CM

## 2017-03-02 DIAGNOSIS — E118 Type 2 diabetes mellitus with unspecified complications: Secondary | ICD-10-CM

## 2017-03-02 LAB — BASIC METABOLIC PANEL
Anion gap: 8 (ref 5–15)
BUN: 15 mg/dL (ref 6–20)
CALCIUM: 8.6 mg/dL — AB (ref 8.9–10.3)
CHLORIDE: 107 mmol/L (ref 101–111)
CO2: 24 mmol/L (ref 22–32)
CREATININE: 0.83 mg/dL (ref 0.44–1.00)
GFR calc non Af Amer: >60 mL/min (ref >60)
Glucose, Bld: 158 mg/dL — ABNORMAL HIGH (ref 65–99)
Potassium: 4 mmol/L (ref 3.5–5.1)
SODIUM: 139 mmol/L (ref 135–145)

## 2017-03-02 LAB — GLUCOSE, CAPILLARY
GLUCOSE-CAPILLARY: 143 mg/dL — AB (ref 65–99)
GLUCOSE-CAPILLARY: 199 mg/dL — AB (ref 65–99)
Glucose-Capillary: 136 mg/dL — ABNORMAL HIGH (ref 65–99)
Glucose-Capillary: 147 mg/dL — ABNORMAL HIGH (ref 65–99)
Glucose-Capillary: 168 mg/dL — ABNORMAL HIGH (ref 65–99)

## 2017-03-02 LAB — MAGNESIUM: MAGNESIUM: 2 mg/dL (ref 1.7–2.4)

## 2017-03-02 MED ORDER — HYDROCHLOROTHIAZIDE 25 MG PO TABS
25.0000 mg | ORAL_TABLET | Freq: Every day | ORAL | Status: DC
  Administered 2017-03-02: 25 mg via ORAL
  Filled 2017-03-02: qty 1

## 2017-03-02 MED ORDER — QUETIAPINE FUMARATE 25 MG PO TABS: 25.0000 mg | ORAL_TABLET | Freq: Every evening | ORAL | 0 refills | Status: DC | PRN

## 2017-03-02 NOTE — Discharge Summary (Addendum)
Physician Discharge Summary  TONY FRISCIA FAO:[id number] DOB: [birth year] DOA: 02/27/2017  PCP: Seward Carol, MD  Admit date: 02/27/2017 Discharge date: 03/02/2017  Admitted From: home Disposition:  home   Recommendations for Outpatient Follow-up:  1. Bmet in 1 wk - on HCTZ  Home Health:  ordered Discharge Condition:  stable   CODE STATUS:  Full code  Consultations:  neurology    Discharge Diagnoses:  Principal Problem:   Acute embolic stroke [facility]) Active Problems:   Essential hypertension   Sundowning   Chronic diastolic CHF (congestive heart failure) (HCC)   Hypokalemia   DM (diabetes mellitus), type 2 (HCC)   Subjective: No complaints today.   Brief Summary: Brandi Griffin 82 year old female with history of hypertension, type 2 diabetes brought in by patient's daughter for slurred speech. Evaluated by a neurologist and admitted for further evaluation.  Hospital Course:  Principal Problem:  acute embolic ischemic strokes/ slurred speech - Multiple vascular distribution located in the right cerebellum, right occipital lobe, left thalamus and left frontal lobe. -MRI MRA finding reviewed with the patient and her daughter at bedside. Patient still having slurred speech. -LDL elevated to 116, increased the dose of Lipitor to 80 mg. -A1c level 8.1.   -  Echocardiogram completed and does not reveal a thrombus -  carotid ultrasound has been looked at by Dr Erlinda Hong- it is not showing any significant stenosis- official read will be done by Dr Leonie Man -PT, OT evaluation>> home with home health  Grade 2 dCHF - chronic- needs aggressive BP control   Acute delirium - sundown beginning yesterday - follow- added Seroquel last night which she did well with per daughter - family has requested a PRN prescription for her which I have written   Hypertension - 11/3- resumee Lisinopril    - 11/4 resuming HCTZ    Hypokalemia - replaced adequately  Type 2 diabetes -   Continue insulin sliding scale.    Discharge Instructions  Discharge Instructions    Ambulatory referral to Neurology   Complete by:  As directed    An appointment is requested in approximately: 6 weeks Follow up with stroke clinic (Dr Leonie Man preferred, if not available, then consider Cecille Rubin, Dr Terie Purser or Jaynee Eagles whoever is available) at [facility] in about 6-8 weeks. Thanks.   Diet - low sodium heart healthy   Complete by:  As directed    Increase activity slowly   Complete by:  As directed      Allergies as of 03/02/2017   No Known Allergies     Medication List    STOP taking these medications   aspirin 325 MG EC tablet     TAKE these medications   acetaminophen 325 MG tablet Commonly known as:  TYLENOL Take 2 tablets (650 mg total) by mouth every 4 (four) hours as needed for mild pain (or temp > 37.5 C (99.5 F)).   atorvastatin 80 MG tablet Commonly known as:  LIPITOR Take 1 tablet (80 mg total) by mouth daily at 6 PM. What changed:    medication strength  See the new instructions.   BD ULTRA-FINE LANCETS lancets by Other route. Use as instructed   clopidogrel 75 MG tablet Commonly known as:  PLAVIX Take 1 tablet (75 mg total) by mouth daily.   donepezil 10 MG tablet Commonly known as:  ARICEPT Take 1/2 tablet daily for 1 month, then increase to 1 tablet daily   glucose blood test strip 1 each by Other route  as needed for other. Use as instructed   FREESTYLE LITE TEST VI by In Vitro route.   hydrochlorothiazide 25 MG tablet Commonly known as:  HYDRODIURIL Take 25 mg by mouth daily.   lisinopril 20 MG tablet Commonly known as:  PRINIVIL,ZESTRIL Take 20 mg by mouth daily.   metFORMIN 1000 MG tablet Commonly known as:  GLUCOPHAGE TK 2 TS PO QD   pioglitazone 30 MG tablet Commonly known as:  ACTOS TK 1 T PO QD   potassium chloride SA 20 MEQ tablet Commonly known as:  K-DUR,KLOR-CON Take 2 tablets (40 mEq total) by mouth daily.    QUEtiapine 25 MG tablet Commonly known as:  SEROQUEL Take 1 tablet (25 mg total) at bedtime as needed by mouth (agitation).      Follow-up Information    Garvin Fila, MD. Schedule an appointment as soon as possible for a visit in 6 week(s).   Specialties:  Neurology, Radiology Contact information: [address] [place] [address] [phone]        Cameron Sprang, MD. Go on 03/14/2017.   Specialty:  Neurology Contact information: [place] [address] [phone]        Seward Carol, MD Follow up.   Specialty:  Internal Medicine Why:  for the following blood work : Engineer, materials information: [address] [place] [id number] [phone]          No Known Allergies   Procedures/Studies: Study Conclusions  - Left ventricle: The cavity size was normal. Wall thickness was   increased in a pattern of mild LVH. Systolic function was normal.   The estimated ejection fraction was in the range of 60% to 65%.   Wall motion was normal; there were no regional wall motion   abnormalities. Features are consistent with a pseudonormal left   ventricular filling pattern, with concomitant abnormal relaxation   and increased filling pressure (grade 2 diastolic dysfunction). - Aortic valve: There was no stenosis. - Mitral valve: Moderately calcified annulus. There was trivial   regurgitation. - Left atrium: The atrium was mildly dilated. - Right ventricle: The cavity size was normal. Systolic function   was mildly reduced. - Tricuspid valve: Peak RV-RA gradient (S): 25 mm Hg. - Pulmonary arteries: PA peak pressure: 28 mm Hg (S). - Inferior vena cava: The vessel was normal in size. The   respirophasic diameter changes were in the normal range (= 50%),   consistent with normal central venous pressure.  Impressions:  - Normal LV size with mild LV hypertrophy. EF 60-65%. Moderate   diastolic  dysfunction. Normal RV size with mildly decreased   systolic function. No significant valvular abnormalities.  Dg Chest 2 View  Result Date: 02/28/2017 CLINICAL DATA:  Altered mental status. EXAM: CHEST  2 VIEW COMPARISON:  06/11/2008. FINDINGS: Prior median sternotomy. Cardiomegaly. Mild right base atelectasis . No pleural effusion or pneumothorax. No acute bony abnormality . IMPRESSION: 1. Prior median sternotomy. Cardiomegaly. No evidence of overt congestive heart failure. 2. Mild right base atelectasis . Electronically Signed   By: Marcello Moores  Register   On: 02/28/2017 08:00   Ct Head Wo Contrast  Result Date: 02/27/2017 CLINICAL DATA:  Confusion EXAM: CT HEAD WITHOUT CONTRAST TECHNIQUE: Contiguous axial images were obtained from the base of the skull through the vertex without intravenous contrast. COMPARISON:  12/03/2016 FINDINGS: Brain: Moderate small vessel ischemic disease of periventricular and subcortical white matter. No large vascular  territory infarction, hemorrhage or midline shift. No intra-axial mass nor extra-axial fluid collections. No hydrocephalus. Mild superficial and central atrophy. Vascular: No hyperdense vessel or unexpected calcification. Skull: No fracture or suspicious osseous lesions. Sinuses/Orbits: No acute finding. Other: None. IMPRESSION: 1. Chronic moderate small vessel ischemic disease with stable superficial and central atrophy. 2. No acute intracranial abnormality. Electronically Signed   By: Ashley Royalty M.D.   On: 02/27/2017 19:28   Mr Brain Wo Contrast  Result Date: 02/28/2017 CLINICAL DATA:  Acute presentation with speech disturbance. EXAM: MRI HEAD WITHOUT CONTRAST MRA HEAD WITHOUT CONTRAST TECHNIQUE: Multiplanar, multiecho pulse sequences of the brain and surrounding structures were obtained without intravenous contrast. Angiographic images of the head were obtained using MRA technique without contrast. COMPARISON:  CT yesterday.  MRI 10/29/2016. FINDINGS: MRI HEAD  FINDINGS Brain: There are punctate acute infarctions seen in the inferior right cerebellum, right occipital lobe, left thalamus, and left frontal lobe, consistent with micro embolic infarctions from a cardiac or ascending aortic source. No large vessel territory infarction. Chronic small-vessel ischemic changes seen throughout the pons, cerebellum, thalami, basal ganglia and cerebral hemispheric white matter. No mass lesion, hemorrhage, hydrocephalus or extra-axial collection. Vascular: Major vessels at the base of the brain show flow. Skull and upper cervical spine: Negative Sinuses/Orbits: Clear/normal Other: None MRA HEAD FINDINGS Both internal carotid arteries widely patent through the skullbase and siphon regions. The anterior and middle cerebral vessels are patent without proximal stenosis, aneurysm or vascular malformation. Distal branch vessels show some atherosclerotic irregularity. Both vertebral arteries are widely patent to the basilar. No basilar stenosis. Posterior circulation branch vessels are patent. Distal branch vessels show some atherosclerotic irregularity. IMPRESSION: Scattered punctate acute infarctions in multiple vascular distributions consistent with micro embolic infarctions from a cardiac or ascending aortic source. These are located in the right cerebellum, right occipital lobe, left thalamus and left frontal lobe. No large infarction. Extensive chronic small-vessel ischemic changes elsewhere throughout the brain. No large or medium vessel occlusion or proximal stenosis. Distal vessel atherosclerotic irregularity. Electronically Signed   By: Nelson Chimes M.D.   On: 02/28/2017 08:10   Mr Jodene Nam Head Wo Contrast  Result Date: 02/28/2017 CLINICAL DATA:  Acute presentation with speech disturbance. EXAM: MRI HEAD WITHOUT CONTRAST MRA HEAD WITHOUT CONTRAST TECHNIQUE: Multiplanar, multiecho pulse sequences of the brain and surrounding structures were obtained without intravenous contrast.  Angiographic images of the head were obtained using MRA technique without contrast. COMPARISON:  CT yesterday.  MRI 10/29/2016. FINDINGS: MRI HEAD FINDINGS Brain: There are punctate acute infarctions seen in the inferior right cerebellum, right occipital lobe, left thalamus, and left frontal lobe, consistent with micro embolic infarctions from a cardiac or ascending aortic source. No large vessel territory infarction. Chronic small-vessel ischemic changes seen throughout the pons, cerebellum, thalami, basal ganglia and cerebral hemispheric white matter. No mass lesion, hemorrhage, hydrocephalus or extra-axial collection. Vascular: Major vessels at the base of the brain show flow. Skull and upper cervical spine: Negative Sinuses/Orbits: Clear/normal Other: None MRA HEAD FINDINGS Both internal carotid arteries widely patent through the skullbase and siphon regions. The anterior and middle cerebral vessels are patent without proximal stenosis, aneurysm or vascular malformation. Distal branch vessels show some atherosclerotic irregularity. Both vertebral arteries are widely patent to the basilar. No basilar stenosis. Posterior circulation branch vessels are patent. Distal branch vessels show some atherosclerotic irregularity. IMPRESSION: Scattered punctate acute infarctions in multiple vascular distributions consistent with micro embolic infarctions from a cardiac or ascending aortic source. These are located  in the right cerebellum, right occipital lobe, left thalamus and left frontal lobe. No large infarction. Extensive chronic small-vessel ischemic changes elsewhere throughout the brain. No large or medium vessel occlusion or proximal stenosis. Distal vessel atherosclerotic irregularity. Electronically Signed   By: Nelson Chimes M.D.   On: 02/28/2017 08:10       Discharge Exam: Vitals:   03/02/17 0852 03/02/17 1525  BP: (!) 173/58 (!) 163/67  Pulse:  77  Resp:  18  Temp:  99.2 F (37.3 C)  SpO2:  98%    Vitals:   03/02/17 0057 03/02/17 0701 03/02/17 0852 03/02/17 1525  BP: (!) 178/61 (!) 118/97 (!) 173/58 (!) 163/67  Pulse: 61 60  77  Resp: 18 (!) 21  18  Temp:  99.2 F (37.3 C)  99.2 F (37.3 C)  TempSrc:  Oral  Oral  SpO2: 97% 95%  98%  Weight:      Height:        General: Pt is alert, awake, not in acute distress Cardiovascular: RRR, S1/S2 +, no rubs, no gallops Respiratory: CTA bilaterally, no wheezing, no rhonchi Abdominal: Soft, NT, ND, bowel sounds + Extremities: no edema, no cyanosis    The results of significant diagnostics from this hospitalization (including imaging, microbiology, ancillary and laboratory) are listed below for reference.     Microbiology: No results found for this or any previous visit (from the past 240 hour(s)).   Labs: BNP (last 3 results) No results for input(s): BNP in the last 8760 hours. Basic Metabolic Panel: Recent Labs  Lab 02/27/17 2058 02/27/17 2103 02/28/17 1248 03/01/17 0637 03/02/17 0508  NA 139 140 138 141 139  K 3.7 4.2 3.1* 3.3* 4.0  CL 103 102 106 111 107  CO2 27  --  23 22 24   GLUCOSE 236* 238* 146* 105* 158*  BUN 18 22* 11 11 15   CREATININE 0.94 0.90 0.83 0.85 0.83  CALCIUM 8.7*  --  9.0 8.3* 8.6*  MG  --   --  1.7 2.3 2.0   Liver Function Tests: Recent Labs  Lab 02/27/17 2058  AST 17  ALT 12*  ALKPHOS 111  BILITOT 0.4  PROT 6.8  ALBUMIN 3.5   No results for input(s): LIPASE, AMYLASE in the last 168 hours. No results for input(s): AMMONIA in the last 168 hours. CBC: Recent Labs  Lab 02/27/17 2058 02/27/17 2103  WBC 7.1  --   NEUTROABS 4.2  --   HGB 12.1 12.2  HCT 36.0 36.0  MCV 85.5  --   PLT 265  --    Cardiac Enzymes: No results for input(s): CKTOTAL, CKMB, CKMBINDEX, TROPONINI in the last 168 hours. BNP: Invalid input(s): POCBNP CBG: Recent Labs  Lab 03/02/17 0022 03/02/17 0350 03/02/17 0759 03/02/17 1307 03/02/17 1706  GLUCAP 168* 143* 136* 147* 199*   D-Dimer No results  for input(s): DDIMER in the last 72 hours. Hgb A1c Recent Labs    02/28/17 0420  HGBA1C 8.1*   Lipid Profile Recent Labs    02/28/17 0420  CHOL 201*  HDL 74  LDLCALC 116*  TRIG 56  CHOLHDL 2.7   Thyroid function studies No results for input(s): TSH, T4TOTAL, T3FREE, THYROIDAB in the last 72 hours.  Invalid input(s): FREET3 Anemia work up No results for input(s): VITAMINB12, FOLATE, FERRITIN, TIBC, IRON, RETICCTPCT in the last 72 hours. Urinalysis    Component Value Date/Time   COLORURINE YELLOW 02/27/2017 2039   APPEARANCEUR CLEAR 02/27/2017 2039   LABSPEC  1.014 02/27/2017 2039   PHURINE 6.0 02/27/2017 2039   GLUCOSEU 50 (A) 02/27/2017 2039   HGBUR NEGATIVE 02/27/2017 2039   BILIRUBINUR NEGATIVE 02/27/2017 2039   KETONESUR NEGATIVE 02/27/2017 2039   PROTEINUR 100 (A) 02/27/2017 2039   UROBILINOGEN 1.0 01/06/2007 1412   NITRITE NEGATIVE 02/27/2017 2039   LEUKOCYTESUR NEGATIVE 02/27/2017 2039   Sepsis Labs Invalid input(s): PROCALCITONIN,  WBC,  LACTICIDVEN Microbiology No results found for this or any previous visit (from the past 240 hour(s)).   Time coordinating discharge: Over 30 minutes  SIGNED:   Debbe Odea, MD  [facility] 03/02/2017, 5:19 PM Pager   If 7PM-7AM, please contact night-coverage [email or web address] Password [email or web address]

## 2017-03-02 NOTE — Progress Notes (Signed)
[name] to be D/C'd Home per MD order.  Discussed with the patient and all questions fully answered.  VSS, Skin clean, dry and intact without evidence of skin break down, no evidence of skin tears noted. IV catheter discontinued intact. Site without signs and symptoms of complications. Dressing and pressure applied.  An After Visit Summary was printed and given to the patient. Patient received prescription.  D/c education completed with patient/family including follow up instructions, medication list, d/c activities limitations if indicated, with other d/c instructions as indicated by MD - patient able to verbalize understanding, all questions fully answered.   Patient instructed to return to ED, call 911, or call MD for any changes in condition.   Patient escorted via [place], and D/C home via private auto.  Luci Bank 03/02/2017 6:47 PM

## 2017-03-03 ENCOUNTER — Other Ambulatory Visit: Payer: Self-pay | Admitting: Physician Assistant

## 2017-03-03 ENCOUNTER — Ambulatory Visit: Payer: Self-pay | Admitting: Neurology

## 2017-03-03 DIAGNOSIS — I639 Cerebral infarction, unspecified: Secondary | ICD-10-CM

## 2017-03-07 DIAGNOSIS — R413 Other amnesia: Secondary | ICD-10-CM | POA: Diagnosis not present

## 2017-03-07 DIAGNOSIS — I6340 Cerebral infarction due to embolism of unspecified cerebral artery: Secondary | ICD-10-CM | POA: Diagnosis not present

## 2017-03-07 DIAGNOSIS — E119 Type 2 diabetes mellitus without complications: Secondary | ICD-10-CM | POA: Diagnosis not present

## 2017-03-07 DIAGNOSIS — I10 Essential (primary) hypertension: Secondary | ICD-10-CM | POA: Diagnosis not present

## 2017-03-07 DIAGNOSIS — E7800 Pure hypercholesterolemia, unspecified: Secondary | ICD-10-CM | POA: Diagnosis not present

## 2017-03-14 ENCOUNTER — Ambulatory Visit: Payer: Self-pay | Admitting: Neurology

## 2017-04-01 ENCOUNTER — Encounter: Payer: Self-pay | Admitting: Physician Assistant

## 2017-04-11 ENCOUNTER — Encounter: Payer: Self-pay | Admitting: Nurse Practitioner

## 2017-04-15 NOTE — Progress Notes (Deleted)
Electrophysiology Office Note Date: 04/15/2017  ID:  Brandi Griffin, Brandi Griffin Sep 11, 1934, MRN [id number]  PCP: Seward Carol, MD Electrophysiologist: Rayann Heman (new)  CC: to discuss TEE/ILR for stroke evaluation  Brandi Griffin is a 82 y.o. female seen today for Dr Leonie Man.  She was admitted 02/2017 with 2 transient episodes of aphasia.  Imaging demonstrated scattered acute punctate infarcts felt to be embolic 2/2 unknown source. She underwent workup for stroke with no cause identified. She is referred today to discuss TEE/ILR for further stroke evaluation. Since discharge, the patient reports *** doing very well.  She denies chest pain, palpitations, dyspnea, PND, orthopnea, nausea, vomiting, dizziness, syncope, edema, weight gain, or early satiety.  Past Medical History:  Diagnosis Date  . Cancer ([place])   . Diabetes mellitus without complication ([place])   . Hypertension    Past Surgical History:  Procedure Laterality Date  . ABDOMINAL HYSTERECTOMY      Current Outpatient Medications  Medication Sig Dispense Refill  . acetaminophen (TYLENOL) 325 MG tablet Take 2 tablets (650 mg total) by mouth every 4 (four) hours as needed for mild pain (or temp > 37.5 C (99.5 F)).    Marland Kitchen atorvastatin (LIPITOR) 80 MG tablet Take 1 tablet (80 mg total) by mouth daily at 6 PM. 60 tablet 0  . BD ULTRA-FINE LANCETS lancets by Other route. Use as instructed    . clopidogrel (PLAVIX) 75 MG tablet Take 1 tablet (75 mg total) by mouth daily. 30 tablet 0  . donepezil (ARICEPT) 10 MG tablet Take 1/2 tablet daily for 1 month, then increase to 1 tablet daily (Patient not taking: Reported on 02/27/2017) 30 tablet 11  . Glucose Blood (FREESTYLE LITE TEST VI) by In Vitro route.    Marland Kitchen glucose blood test strip 1 each by Other route as needed for other. Use as instructed    . hydrochlorothiazide (HYDRODIURIL) 25 MG tablet Take 25 mg by mouth daily.    Marland Kitchen lisinopril (PRINIVIL,ZESTRIL) 20 MG tablet Take 20 mg by mouth  daily.    . metFORMIN (GLUCOPHAGE) 1000 MG tablet TK 2 TS PO QD  1  . pioglitazone (ACTOS) 30 MG tablet TK 1 T PO QD  3  . potassium chloride SA (K-DUR,KLOR-CON) 20 MEQ tablet Take 2 tablets (40 mEq total) by mouth daily. 30 tablet 0  . QUEtiapine (SEROQUEL) 25 MG tablet Take 1 tablet (25 mg total) at bedtime as needed by mouth (agitation). 15 tablet 0   No current facility-administered medications for this visit.     Allergies:   Patient has no known allergies.   Social History: Social History   Socioeconomic History  . Marital status: Divorced    Spouse name: Not on file  . Number of children: 1  . Years of education: college  . Highest education level: Not on file  Social Needs  . Financial resource strain: Not on file  . Food insecurity - worry: Not on file  . Food insecurity - inability: Not on file  . Transportation needs - medical: Not on file  . Transportation needs - non-medical: Not on file  Occupational History  . Occupation: retired  Tobacco Use  . Smoking status: Never Smoker  . Smokeless tobacco: Never Used  Substance and Sexual Activity  . Alcohol use: No  . Drug use: No  . Sexual activity: Not on file  Other Topics Concern  . Not on file  Social History Narrative   Lives with daughter in a  2 story home.  Has one daughter.  Retired Animal nutritionist.  Education: college.    Family History: Family History  Problem Relation Age of Onset  . Hypertension Mother   . Stroke Mother     Review of Systems: All other systems reviewed and are otherwise negative except as noted above.   Physical Exam: VS:  There were no vitals taken for this visit. , BMI There is no height or weight on file to calculate BMI. Wt Readings from Last 3 Encounters:  03/01/17 128 lb 1.6 oz (58.1 kg)  12/03/16 135 lb (61.2 kg)  09/11/16 131 lb 5 oz (59.6 kg)    GEN- The patient is well appearing, alert and oriented x 3 today.   HEENT: normocephalic, atraumatic; sclera clear,  conjunctiva pink; hearing intact; oropharynx clear; neck supple, no JVP Lymph- no cervical lymphadenopathy Lungs- Clear to ausculation bilaterally, normal work of breathing.  No wheezes, rales, rhonchi Heart- Regular rate and rhythm, no murmurs, rubs or gallops, PMI not laterally displaced GI- soft, non-tender, non-distended, bowel sounds present, no hepatosplenomegaly Extremities- no clubbing, cyanosis, or edema; DP/PT/radial pulses 2+ bilaterally MS- no significant deformity or atrophy Skin- warm and dry, no rash or lesion  Psych- euthymic mood, full affect Neuro- strength and sensation are intact   EKG:  EKG is not ordered today. All prior EKG's reviewed with no AF identified. CV monitor strips from hospitalization with no AF  Recent Labs: 02/27/2017: ALT 12; Hemoglobin 12.2; Platelets 265 03/02/2017: BUN 15; Creatinine, Ser 0.83; Magnesium 2.0; Potassium 4.0; Sodium 139    Other studies Reviewed: Additional studies/ records that were reviewed today include: hospital records  Assessment and Plan:  1.  Cryptogenic stroke The patient presented with cryptogenic stroke. Neurology has recommended TEE/ILR to further evaluate.  Rationale, risks, benefits reviewed with patient and *** today. ***   Current medicines are reviewed at length with the patient today.   The patient does not have concerns regarding her medicines.  The following changes were made today:  none  Labs/ tests ordered today include: none No orders of the defined types were placed in this encounter.    Disposition:   Follow up with *** {gen number [id number]} {TIME; UNITS DAY/WEEK/MONTH:19136}   Signed, Chanetta Marshall, NP 04/15/2017 8:27 AM   [facility] [address] [place] [address] [phone] (office) [phone] (fax)

## 2017-04-17 ENCOUNTER — Ambulatory Visit: Payer: Self-pay | Admitting: Nurse Practitioner

## 2017-05-15 ENCOUNTER — Encounter: Payer: Self-pay | Admitting: Nurse Practitioner

## 2017-05-22 DIAGNOSIS — Z7984 Long term (current) use of oral hypoglycemic drugs: Secondary | ICD-10-CM | POA: Diagnosis not present

## 2017-05-22 DIAGNOSIS — I10 Essential (primary) hypertension: Secondary | ICD-10-CM | POA: Diagnosis not present

## 2017-05-22 DIAGNOSIS — E7800 Pure hypercholesterolemia, unspecified: Secondary | ICD-10-CM | POA: Diagnosis not present

## 2017-05-22 DIAGNOSIS — I6340 Cerebral infarction due to embolism of unspecified cerebral artery: Secondary | ICD-10-CM | POA: Diagnosis not present

## 2017-05-22 DIAGNOSIS — E119 Type 2 diabetes mellitus without complications: Secondary | ICD-10-CM | POA: Diagnosis not present

## 2017-05-22 DIAGNOSIS — R413 Other amnesia: Secondary | ICD-10-CM | POA: Diagnosis not present

## 2017-06-04 ENCOUNTER — Encounter: Payer: Self-pay | Admitting: Neurology

## 2017-06-04 ENCOUNTER — Other Ambulatory Visit: Payer: Self-pay

## 2017-06-04 ENCOUNTER — Ambulatory Visit (INDEPENDENT_AMBULATORY_CARE_PROVIDER_SITE_OTHER): Payer: Medicare (Managed Care) | Admitting: Neurology

## 2017-06-04 VITALS — BP 136/82 | HR 72 | Ht 61.0 in | Wt 142.0 lb

## 2017-06-04 DIAGNOSIS — F03A0 Unspecified dementia, mild, without behavioral disturbance, psychotic disturbance, mood disturbance, and anxiety: Secondary | ICD-10-CM

## 2017-06-04 DIAGNOSIS — F0390 Unspecified dementia without behavioral disturbance: Secondary | ICD-10-CM

## 2017-06-04 DIAGNOSIS — I6349 Cerebral infarction due to embolism of other cerebral artery: Secondary | ICD-10-CM | POA: Diagnosis not present

## 2017-06-04 MED ORDER — RIVASTIGMINE TARTRATE 1.5 MG PO CAPS: 1.5000 mg | ORAL_CAPSULE | Freq: Two times a day (BID) | ORAL | 11 refills | Status: DC

## 2017-06-04 NOTE — Progress Notes (Signed)
NEUROLOGY FOLLOW UP OFFICE NOTE  MCCALL WILL [id number] 09-28-1934  HISTORY OF PRESENT ILLNESS: I had the pleasure of seeing Brandi Griffin in follow-up in the neurology clinic on 06/04/2017.  The patient was last seen 9 months ago for mild dementia and is again accompanied by her daughter who helps supplement the history today.  Records and images were personally reviewed where available.  Since her initial visit, she has been to the hospital twice, in June 2018 for hypertensive urgency with confusion and BP of 222.71. She was back in the hospital last 02/28/17 when her family noted an episode of unintelligible speech while riding in the car with her daughter. She abruptly started speaking gibberish. She did not recall this, but due to memory issues, this is typical for her. There was also note she was moving her arm abnormally like "flinching," not clearly twitching or rhythmic. She had an EEG which showed mild to moderate diffuse background slowing. She had an MRI brain which I personally reviewed, there were punctate acute infarcts in the inferior right cerebellum, right occipital lobe, left thalamus, left frontal lobe, consistent with microembolic infarctions from a cardiac or ascending aortic source.  There is extensive chronic microvascular disease. TTE showed an EF of 60-65%, mild LVH, mildly dilated left atrium. She was discharged home on Plavix with instructions for outpatient TEE and loop recorder. Her daughter did not know about this, and has not scheduled the tests from November 2018. They report she has been doing well, no further episodes of speech changes. Her last HbA1c was 8.0. Her statin dose was increased by her PCP recently. Her memory continues to worsen. She had side effects of agitation on the Aricept. Her daughter now administers medications and is in charge of finances. She does not drive, but does not recall how to get to places any more. She has been coming to her PCP in  this building for 20 years, and could not tell her niece how to get here one time. She needs help picking out her clothes and needs reminders to bathe, brush her teeth, and comb her hair, but is able to do them independently. She denies any headaches, dizziness, vision changes, focal numbness/tingling/weakness, no falls.   PAST MEDICAL HISTORY: Past Medical History:  Diagnosis Date  . Cancer ([place])   . Diabetes mellitus without complication ([place])   . Hypertension     MEDICATIONS: Current Outpatient Medications on File Prior to Visit  Medication Sig Dispense Refill  . acetaminophen (TYLENOL) 325 MG tablet Take 2 tablets (650 mg total) by mouth every 4 (four) hours as needed for mild pain (or temp > 37.5 C (99.5 F)).    Marland Kitchen atorvastatin (LIPITOR) 80 MG tablet Take 1 tablet (80 mg total) by mouth daily at 6 PM. 60 tablet 0  . BD ULTRA-FINE LANCETS lancets by Other route. Use as instructed    . clopidogrel (PLAVIX) 75 MG tablet Take 1 tablet (75 mg total) by mouth daily. 30 tablet 0  . Glucose Blood (FREESTYLE LITE TEST VI) by In Vitro route.    Marland Kitchen glucose blood test strip 1 each by Other route as needed for other. Use as instructed    . hydrochlorothiazide (HYDRODIURIL) 25 MG tablet Take 25 mg by mouth daily.    Marland Kitchen lisinopril (PRINIVIL,ZESTRIL) 20 MG tablet Take 20 mg by mouth daily.    . metFORMIN (GLUCOPHAGE) 1000 MG tablet TK 2 TS PO QD  1  . pioglitazone (ACTOS) 30 MG tablet  TK 1 T PO QD  3  . potassium chloride SA (K-DUR,KLOR-CON) 20 MEQ tablet Take 2 tablets (40 mEq total) by mouth daily. 30 tablet 0  . donepezil (ARICEPT) 10 MG tablet Take 1/2 tablet daily for 1 month, then increase to 1 tablet daily (Patient not taking: Reported on 02/27/2017) 30 tablet 11  . QUEtiapine (SEROQUEL) 25 MG tablet Take 1 tablet (25 mg total) at bedtime as needed by mouth (agitation). (Patient not taking: Reported on 06/04/2017) 15 tablet 0   No current facility-administered medications on file prior to visit.      ALLERGIES: No Known Allergies  FAMILY HISTORY: Family History  Problem Relation Age of Onset  . Hypertension Mother   . Stroke Mother     SOCIAL HISTORY: Social History   Socioeconomic History  . Marital status: Divorced    Spouse name: Not on file  . Number of children: 1  . Years of education: college  . Highest education level: Not on file  Social Needs  . Financial resource strain: Not on file  . Food insecurity - worry: Not on file  . Food insecurity - inability: Not on file  . Transportation needs - medical: Not on file  . Transportation needs - non-medical: Not on file  Occupational History  . Occupation: retired  Tobacco Use  . Smoking status: Never Smoker  . Smokeless tobacco: Never Used  Substance and Sexual Activity  . Alcohol use: No  . Drug use: No  . Sexual activity: Not on file  Other Topics Concern  . Not on file  Social History Narrative   Lives with daughter in a 2 story home.  Has one daughter.  Retired Animal nutritionist.  Education: college.    REVIEW OF SYSTEMS: Constitutional: No fevers, chills, or sweats, no generalized fatigue, change in appetite Eyes: No visual changes, double vision, eye pain Ear, nose and throat: No hearing loss, ear pain, nasal congestion, sore throat Cardiovascular: No chest pain, palpitations Respiratory:  No shortness of breath at rest or with exertion, wheezes GastrointestinaI: No nausea, vomiting, diarrhea, abdominal pain, fecal incontinence Genitourinary:  No dysuria, urinary retention or frequency Musculoskeletal:  No neck pain, back pain Integumentary: No rash, pruritus, skin lesions Neurological: as above Psychiatric: No depression, insomnia, anxiety Endocrine: No palpitations, fatigue, diaphoresis, mood swings, change in appetite, change in weight, increased thirst Hematologic/Lymphatic:  No anemia, purpura, petechiae. Allergic/Immunologic: no itchy/runny eyes, nasal congestion, recent allergic  reactions, rashes  PHYSICAL EXAM: Vitals:   06/04/17 0956  BP: 136/82  Pulse: 72  SpO2: 93%   General: No acute distress Head:  Normocephalic/atraumatic Neck: supple, no paraspinal tenderness, full range of motion Heart:  Regular rate and rhythm Lungs:  Clear to auscultation bilaterally Back: No paraspinal tenderness Skin/Extremities: No rash, no edema Neurological Exam: alert and oriented to person, place, and time. No aphasia or dysarthria. Fund of knowledge is appropriate.  Recent and remote memory are intact.  Attention and concentration are normal.    Able to name objects and repeat phrases.  MMSE - Mini Mental State Exam 06/04/2017 09/11/2016  Orientation to time 4 4  Orientation to Place 5 5  Registration 3 3  Attention/ Calculation 5 5  Recall 0 0  Language- name 2 objects 2 2  Language- repeat 1 1  Language- follow 3 step command 3 3  Language- read & follow direction 1 1  Write a sentence 1 1  Copy design 1 1  Total score 26 26  Cranial nerves: Pupils equal, round, reactive to light.  Extraocular movements intact with no nystagmus. Visual fields full. Facial sensation intact. No facial asymmetry. Tongue, uvula, palate midline.  Motor: Bulk and tone normal, muscle strength 5/5 throughout with no pronator drift.  Sensation to light touch, temperature intact.  No extinction to double simultaneous stimulation.  Deep tendon reflexes 2+ throughout, toes downgoing.  Finger to nose testing intact.  Gait narrow-based and steady, mild difficulty with tandem walk but able. Romberg negative.  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 She does not have any residual deficits from the strokes. She is now on Plavix 75mg  daily. It was recommended she have an outpatient TEE and loop recorder, which has not yet  been done. A 30-day holter monitor will be ordered. Continue control of vascular risk factors, her last HbA1 was still elevated at 8. We discussed side effects on Aricept, family is willing to try a different medication, she will start Exelon 1.5mg  BID, side effects were discussed. They know to go to the ER immediately for any change in symptoms. She will follow-up in 6 months and knows to call for any questions/concerns.  Thank you for allowing me to participate in her care.  Please do not hesitate to call for any questions or concerns.  The duration of this appointment visit was 25 minutes of face-to-face time with the patient.  Greater than 50% of this time was spent in counseling, explanation of diagnosis, planning of further management, and coordination of care.   Ellouise Newer, M.D.   CC: Dr. Delfina Redwood

## 2017-06-04 NOTE — Patient Instructions (Signed)
1. Schedule 30-day holter monitor 2. Start Exelon 1.5mg  twice a day 3. Continue daily Plavix, control of diabetes, blood pressure, cholesterol 4. For any sudden change in symptoms, go to ER immediately 5. Follow-up in 6 months, call for any changes  FALL PRECAUTIONS: Be cautious when walking. Scan the area for obstacles that may increase the risk of trips and falls. When getting up in the mornings, sit up at the edge of the bed for a few minutes before getting out of bed. Consider elevating the bed at the head end to avoid drop of blood pressure when getting up. Walk always in a well-lit room (use night lights in the walls). Avoid area rugs or power cords from appliances in the middle of the walkways. Use a walker or a cane if necessary and consider physical therapy for balance exercise. Get your eyesight checked regularly.  FINANCIAL OVERSIGHT: Supervision, especially oversight when making financial decisions or transactions is also recommended.  HOME SAFETY: Consider the safety of the kitchen when operating appliances like stoves, microwave oven, and blender. Consider having supervision and share cooking responsibilities until no longer able to participate in those. Accidents with firearms and other hazards in the house should be identified and addressed as well.  DRIVING: Regarding driving, in patients with progressive memory problems, driving will be impaired. We advise to have someone else do the driving if trouble finding directions or if minor accidents are reported. Independent driving assessment is available to determine safety of driving.  ABILITY TO BE LEFT ALONE: If patient is unable to contact 911 operator, consider using LifeLine, or when the need is there, arrange for someone to stay with patients. Smoking is a fire hazard, consider supervision or cessation. Risk of wandering should be assessed by caregiver and if detected at any point, supervision and safe proof recommendations should  be instituted.  MEDICATION SUPERVISION: Inability to self-administer medication needs to be constantly addressed. Implement a mechanism to ensure safe administration of the medications.  RECOMMENDATIONS FOR ALL PATIENTS WITH MEMORY PROBLEMS: 1. Continue to exercise (Recommend 30 minutes of walking everyday, or 3 hours every week) 2. Increase social interactions - continue going to [place] and enjoy social gatherings with friends and family 3. Eat healthy, avoid fried foods and eat more fruits and vegetables 4. Maintain adequate blood pressure, blood sugar, and blood cholesterol level. Reducing the risk of stroke and cardiovascular disease also helps promoting better memory. 5. Avoid stressful situations. Live a simple life and avoid aggravations. Organize your time and prepare for the next day in anticipation. 6. Sleep well, avoid any interruptions of sleep and avoid any distractions in the bedroom that may interfere with adequate sleep quality 7. Avoid sugar, avoid sweets as there is a strong link between excessive sugar intake, diabetes, and cognitive impairment We discussed the Mediterranean diet, which has been shown to help patients reduce the risk of progressive memory disorders and reduces cardiovascular risk. This includes eating fish, eat fruits and green leafy vegetables, nuts like almonds and hazelnuts, walnuts, and also use olive oil. Avoid fast foods and fried foods as much as possible. Avoid sweets and sugar as sugar use has been linked to worsening of memory function.  There is always a concern of gradual progression of memory problems. If this is the case, then we may need to adjust level of care according to patient needs. Support, both to the patient and caregiver, should then be put into place.

## 2017-06-04 NOTE — Addendum Note (Signed)
Addended by: Lenny Pastel on: 06/04/2017 10:45 AM   Modules accepted: Orders

## 2017-06-06 ENCOUNTER — Encounter: Payer: Self-pay | Admitting: *Deleted

## 2017-07-10 ENCOUNTER — Ambulatory Visit (INDEPENDENT_AMBULATORY_CARE_PROVIDER_SITE_OTHER): Payer: Medicare Other

## 2017-07-10 ENCOUNTER — Other Ambulatory Visit: Payer: Self-pay | Admitting: Neurology

## 2017-07-10 DIAGNOSIS — F03A0 Unspecified dementia, mild, without behavioral disturbance, psychotic disturbance, mood disturbance, and anxiety: Secondary | ICD-10-CM

## 2017-07-10 DIAGNOSIS — I4891 Unspecified atrial fibrillation: Secondary | ICD-10-CM

## 2017-07-10 DIAGNOSIS — I6349 Cerebral infarction due to embolism of other cerebral artery: Secondary | ICD-10-CM

## 2017-07-10 DIAGNOSIS — I639 Cerebral infarction, unspecified: Secondary | ICD-10-CM

## 2017-07-10 DIAGNOSIS — F0390 Unspecified dementia without behavioral disturbance: Secondary | ICD-10-CM | POA: Diagnosis not present

## 2017-08-06 ENCOUNTER — Ambulatory Visit: Payer: Self-pay | Admitting: Neurology

## 2017-09-15 DIAGNOSIS — R52 Pain, unspecified: Secondary | ICD-10-CM | POA: Diagnosis not present

## 2017-09-15 DIAGNOSIS — I10 Essential (primary) hypertension: Secondary | ICD-10-CM | POA: Diagnosis not present

## 2017-09-15 DIAGNOSIS — S40862A Insect bite (nonvenomous) of left upper arm, initial encounter: Secondary | ICD-10-CM | POA: Diagnosis not present

## 2017-09-15 DIAGNOSIS — L299 Pruritus, unspecified: Secondary | ICD-10-CM | POA: Diagnosis not present

## 2017-09-17 ENCOUNTER — Other Ambulatory Visit (HOSPITAL_COMMUNITY): Payer: Self-pay | Admitting: Internal Medicine

## 2017-09-17 DIAGNOSIS — I6340 Cerebral infarction due to embolism of unspecified cerebral artery: Secondary | ICD-10-CM

## 2017-09-17 DIAGNOSIS — E119 Type 2 diabetes mellitus without complications: Secondary | ICD-10-CM | POA: Diagnosis not present

## 2017-09-17 DIAGNOSIS — R413 Other amnesia: Secondary | ICD-10-CM | POA: Diagnosis not present

## 2017-09-17 DIAGNOSIS — E7800 Pure hypercholesterolemia, unspecified: Secondary | ICD-10-CM | POA: Diagnosis not present

## 2017-09-17 DIAGNOSIS — I10 Essential (primary) hypertension: Secondary | ICD-10-CM | POA: Diagnosis not present

## 2017-09-25 ENCOUNTER — Encounter (INDEPENDENT_AMBULATORY_CARE_PROVIDER_SITE_OTHER): Payer: Self-pay

## 2017-09-25 ENCOUNTER — Ambulatory Visit (INDEPENDENT_AMBULATORY_CARE_PROVIDER_SITE_OTHER): Payer: Medicare Other

## 2017-09-25 ENCOUNTER — Other Ambulatory Visit (HOSPITAL_COMMUNITY): Payer: Self-pay | Admitting: Internal Medicine

## 2017-09-25 DIAGNOSIS — I6340 Cerebral infarction due to embolism of unspecified cerebral artery: Secondary | ICD-10-CM | POA: Diagnosis not present

## 2017-09-25 DIAGNOSIS — I4891 Unspecified atrial fibrillation: Secondary | ICD-10-CM | POA: Diagnosis not present

## 2017-09-25 DIAGNOSIS — I639 Cerebral infarction, unspecified: Secondary | ICD-10-CM

## 2017-09-29 ENCOUNTER — Telehealth: Payer: Self-pay | Admitting: *Deleted

## 2017-09-29 NOTE — Telephone Encounter (Signed)
Lm to call back re abnormal monitor report ./cy

## 2017-09-29 NOTE — Telephone Encounter (Signed)
lmtcb ./cy 

## 2017-09-29 NOTE — Telephone Encounter (Signed)
Follow up    Patient daughter is returning your call

## 2017-09-29 NOTE — Telephone Encounter (Signed)
Follow up   Dr Delfina Redwood ofc called back , they do not understand what you want them to do, please call them back, if you leave message , make sure it is detailed

## 2017-09-29 NOTE — Telephone Encounter (Signed)
Monitor report received by a triage nurse earlier. Monitor report was taken to DOD at that time. Monitor showed New Atrial Fibrillation in the 80s on 09/26/17. Patient not established with cardiology. Per DOD, refer to cardiology.   Called to follow up with daughter [name] regarding monitor report and to offer new patient appointment. Daughter states that she called Dr. Lina Sar office and the patient is going to see Dr. Delfina Redwood tomorrow morning. They will wait and see what Dr. Delfina Redwood says. Faxed over copy of monitor report to Dr. Lina Sar office for review. Confirmation received that fax was successful. Monitor report placed into bin to scanned to patient's chart.

## 2017-09-29 NOTE — Telephone Encounter (Signed)
Pt's daughter aware and will call Dr Lina Sar office tomorrow and get recommendations ./cy

## 2017-09-29 NOTE — Telephone Encounter (Signed)
Lm at Dr Polite's office re monitor.per Dr Meda Coffee new a fib .Adonis Housekeeper

## 2017-09-29 NOTE — Telephone Encounter (Signed)
Lm for pt's daughter to call back ./cy 

## 2017-09-30 DIAGNOSIS — I6340 Cerebral infarction due to embolism of unspecified cerebral artery: Secondary | ICD-10-CM | POA: Diagnosis not present

## 2017-09-30 DIAGNOSIS — I4891 Unspecified atrial fibrillation: Secondary | ICD-10-CM | POA: Diagnosis not present

## 2017-10-02 ENCOUNTER — Encounter (HOSPITAL_COMMUNITY): Payer: Self-pay | Admitting: Nurse Practitioner

## 2017-10-02 ENCOUNTER — Ambulatory Visit (HOSPITAL_COMMUNITY)
Admission: RE | Admit: 2017-10-02 | Discharge: 2017-10-02 | Disposition: A | Discharge status: Home / Self Care | Payer: Medicare Other | Source: Ambulatory Visit | Attending: Nurse Practitioner | Admitting: Nurse Practitioner

## 2017-10-02 VITALS — BP 184/76 | HR 63 | Ht 61.0 in | Wt 140.0 lb

## 2017-10-02 DIAGNOSIS — Z7901 Long term (current) use of anticoagulants: Secondary | ICD-10-CM | POA: Insufficient documentation

## 2017-10-02 DIAGNOSIS — Z8249 Family history of ischemic heart disease and other diseases of the circulatory system: Secondary | ICD-10-CM | POA: Diagnosis not present

## 2017-10-02 DIAGNOSIS — Z823 Family history of stroke: Secondary | ICD-10-CM | POA: Diagnosis not present

## 2017-10-02 DIAGNOSIS — E119 Type 2 diabetes mellitus without complications: Secondary | ICD-10-CM | POA: Diagnosis not present

## 2017-10-02 DIAGNOSIS — Z79899 Other long term (current) drug therapy: Secondary | ICD-10-CM | POA: Insufficient documentation

## 2017-10-02 DIAGNOSIS — I4891 Unspecified atrial fibrillation: Secondary | ICD-10-CM | POA: Diagnosis present

## 2017-10-02 DIAGNOSIS — Z90710 Acquired absence of both cervix and uterus: Secondary | ICD-10-CM | POA: Insufficient documentation

## 2017-10-02 DIAGNOSIS — Z7984 Long term (current) use of oral hypoglycemic drugs: Secondary | ICD-10-CM | POA: Diagnosis not present

## 2017-10-02 DIAGNOSIS — I10 Essential (primary) hypertension: Secondary | ICD-10-CM | POA: Insufficient documentation

## 2017-10-02 DIAGNOSIS — I480 Paroxysmal atrial fibrillation: Secondary | ICD-10-CM

## 2017-10-02 MED ORDER — METOPROLOL TARTRATE 25 MG PO TABS: 12.5000 mg | ORAL_TABLET | Freq: Two times a day (BID) | ORAL | 2 refills | Status: AC

## 2017-10-02 NOTE — Patient Instructions (Signed)
Start Metoprolol 1/2 tablet twice a day with food

## 2017-10-02 NOTE — Progress Notes (Addendum)
Primary Care Physician: Seward Carol, MD Referring Physician: Dr. Lillette Boxer Brandi Griffin is a 83 y.o. female with a h/o DM, CA, HTN,  CVA in multiple territories in December of 2018. A monitor was attempted at that time but was technically challenging for pt and she only wore for a few minutes.She is now wearing an event  monitor for one week and afib was found. She is in the afib clinic for further f/u.  She has been started on eliquis 5 mg  since Tuesday. Pt's daughter has been giving only one time a day by error, but now will give it to her 2x a day. She appears to be in an ectopic atrial rhythm today at 63 bpm.She is not aware when she is in afib. Denies bleed in history. steady on her feet. Does not use tobacco, alcohol, minimal caffeine.  Today, she denies symptoms of palpitations, chest pain, shortness of breath, orthopnea, PND, lower extremity edema, dizziness, presyncope, syncope, or neurologic sequela. The patient is tolerating medications without difficulties and is otherwise without complaint today.   Past Medical History:  Diagnosis Date  . Cancer ([place])   . Diabetes mellitus without complication ([place])   . Hypertension    Past Surgical History:  Procedure Laterality Date  . ABDOMINAL HYSTERECTOMY      Current Outpatient Medications  Medication Sig Dispense Refill  . acetaminophen (TYLENOL) 325 MG tablet Take 2 tablets (650 mg total) by mouth every 4 (four) hours as needed for mild pain (or temp > 37.5 C (99.5 F)).    Marland Kitchen atorvastatin (LIPITOR) 80 MG tablet Take 1 tablet (80 mg total) by mouth daily at 6 PM. 60 tablet 0  . BD ULTRA-FINE LANCETS lancets by Other route. Use as instructed    . ELIQUIS 5 MG TABS tablet 1 tablet 2 (two) times daily.    . Glucose Blood (FREESTYLE LITE TEST VI) by In Vitro route.    Marland Kitchen glucose blood test strip 1 each by Other route as needed for other. Use as instructed    . hydrochlorothiazide (HYDRODIURIL) 25 MG tablet Take 25 mg by mouth  daily.    Marland Kitchen lisinopril (PRINIVIL,ZESTRIL) 20 MG tablet Take 20 mg by mouth daily.    . metFORMIN (GLUCOPHAGE) 1000 MG tablet TK 2 TS PO QD  1  . pioglitazone (ACTOS) 30 MG tablet TK 1 T PO QD  3  . potassium chloride SA (K-DUR,KLOR-CON) 20 MEQ tablet Take 2 tablets (40 mEq total) by mouth daily. 30 tablet 0  . rivastigmine (EXELON) 1.5 MG capsule Take 1 capsule (1.5 mg total) by mouth 2 (two) times daily. 60 capsule 11  . metoprolol tartrate (LOPRESSOR) 25 MG tablet Take 0.5 tablets (12.5 mg total) by mouth 2 (two) times daily. 30 tablet 2   No current facility-administered medications for this encounter.     No Known Allergies  Social History   Socioeconomic History  . Marital status: Divorced    Spouse name: Not on file  . Number of children: 1  . Years of education: college  . Highest education level: Not on file  Occupational History  . Occupation: retired  Scientific laboratory technician  . Financial resource strain: Not on file  . Food insecurity:    Worry: Not on file    Inability: Not on file  . Transportation needs:    Medical: Not on file    Non-medical: Not on file  Tobacco Use  . Smoking status: Never Smoker  .  Smokeless tobacco: Never Used  Substance and Sexual Activity  . Alcohol use: No  . Drug use: No  . Sexual activity: Not on file  Lifestyle  . Physical activity:    Days per week: Not on file    Minutes per session: Not on file  . Stress: Not on file  Relationships  . Social connections:    Talks on phone: Not on file    Gets together: Not on file    Attends religious service: Not on file    Active member of club or organization: Not on file    Attends meetings of clubs or organizations: Not on file    Relationship status: Not on file  . Intimate partner violence:    Fear of current or ex partner: Not on file    Emotionally abused: Not on file    Physically abused: Not on file    Forced sexual activity: Not on file  Other Topics Concern  . Not on file  Social  History Narrative   Lives with daughter in a 2 story home.  Has one daughter.  Retired Animal nutritionist.  Education: college.    Family History  Problem Relation Age of Onset  . Hypertension Mother   . Stroke Mother     ROS- All systems are reviewed and negative except as per the HPI above  Physical Exam: Vitals:   10/02/17 0845  BP: (!) 184/76  Pulse: 63  Weight: 140 lb (63.5 kg)  Height: 5\' 1"  (1.549 m)   Wt Readings from Last 3 Encounters:  10/02/17 140 lb (63.5 kg)  06/04/17 142 lb (64.4 kg)  03/01/17 128 lb 1.6 oz (58.1 kg)    Labs: Lab Results  Component Value Date   NA 139 03/02/2017   K 4.0 03/02/2017   CL 107 03/02/2017   CO2 24 03/02/2017   GLUCOSE 158 (H) 03/02/2017   BUN 15 03/02/2017   CREATININE 0.83 03/02/2017   CALCIUM 8.6 (L) 03/02/2017   MG 2.0 03/02/2017   Lab Results  Component Value Date   INR 0.92 02/27/2017   Lab Results  Component Value Date   CHOL 201 (H) 02/28/2017   HDL 74 02/28/2017   LDLCALC 116 (H) 02/28/2017   TRIG 56 02/28/2017     GEN- The patient is well appearing, alert and oriented x 3 today.   Head- normocephalic, atraumatic Eyes-  Sclera clear, conjunctiva pink Ears- hearing intact Oropharynx- clear Neck- supple, no JVP Lymph- no cervical lymphadenopathy Lungs- Clear to ausculation bilaterally, normal work of breathing Heart- Regular rate and rhythm, no murmurs, rubs or gallops, PMI not laterally displaced GI- soft, NT, ND, + BS Extremities- no clubbing, cyanosis, or edema MS- no significant deformity or atrophy Skin- no rash or lesion Psych- euthymic mood, full affect Neuro- strength and sensation are intact  EKG- Unusual P axis, 63 bpm, LAD, pr int 178 ms, qrs int 90 ms, qtrc 450 ms Telemetry strips reviewed Echo -fall 2018-Impressions:  - Normal LV size with mild LV hypertrophy. EF 60-65%. Moderate   diastolic dysfunction. Normal RV size with mildly decreased   systolic function. No significant  valvular abnormalities.    Assessment and Plan: 1. New onset documented afib by telemetry General info re afib Previous CVA's in the fall of 2018 Continue wearing event monitor , has 3 weeks left Start metoprolol tartrate 25 mg 1/2 tab bid  2. Chadsvasc score of at least 7 Continue with eliquis 5 mg bid Denies bleeding history  Advised no antiinflammatories Bleeding precautions discussed   3. HTN Poorly controlled today Pt states not usually this high Avoid salt Addition of BB may help  F/u in afib clinic in 2 weeks  Butch Penny C. Michoel Kunin, [facility] [address] [place], [place] [address] [phone]

## 2017-10-06 ENCOUNTER — Telehealth: Payer: Self-pay

## 2017-10-06 NOTE — Telephone Encounter (Signed)
Left a VM for Brandi Griffin to call back in regards to the PREP at [facility]

## 2017-10-16 ENCOUNTER — Ambulatory Visit (HOSPITAL_COMMUNITY)
Admission: RE | Admit: 2017-10-16 | Discharge: 2017-10-16 | Disposition: A | Discharge status: Home / Self Care | Payer: Medicare Other | Source: Ambulatory Visit | Attending: Nurse Practitioner | Admitting: Nurse Practitioner

## 2017-10-16 ENCOUNTER — Encounter (HOSPITAL_COMMUNITY): Payer: Self-pay | Admitting: Nurse Practitioner

## 2017-10-16 VITALS — BP 148/76 | HR 49 | Ht 61.0 in | Wt 139.6 lb

## 2017-10-16 DIAGNOSIS — R001 Bradycardia, unspecified: Secondary | ICD-10-CM | POA: Insufficient documentation

## 2017-10-16 DIAGNOSIS — Z7901 Long term (current) use of anticoagulants: Secondary | ICD-10-CM | POA: Insufficient documentation

## 2017-10-16 DIAGNOSIS — Z8673 Personal history of transient ischemic attack (TIA), and cerebral infarction without residual deficits: Secondary | ICD-10-CM | POA: Diagnosis not present

## 2017-10-16 DIAGNOSIS — Z79899 Other long term (current) drug therapy: Secondary | ICD-10-CM | POA: Insufficient documentation

## 2017-10-16 DIAGNOSIS — I10 Essential (primary) hypertension: Secondary | ICD-10-CM | POA: Insufficient documentation

## 2017-10-16 DIAGNOSIS — E119 Type 2 diabetes mellitus without complications: Secondary | ICD-10-CM | POA: Diagnosis not present

## 2017-10-16 DIAGNOSIS — Z7984 Long term (current) use of oral hypoglycemic drugs: Secondary | ICD-10-CM | POA: Insufficient documentation

## 2017-10-16 DIAGNOSIS — I480 Paroxysmal atrial fibrillation: Secondary | ICD-10-CM | POA: Insufficient documentation

## 2017-10-16 DIAGNOSIS — I491 Atrial premature depolarization: Secondary | ICD-10-CM | POA: Diagnosis not present

## 2017-10-16 DIAGNOSIS — Z8249 Family history of ischemic heart disease and other diseases of the circulatory system: Secondary | ICD-10-CM | POA: Insufficient documentation

## 2017-10-16 HISTORY — DX: Paroxysmal atrial fibrillation: I48.0

## 2017-10-16 HISTORY — DX: Cerebral infarction, unspecified: I63.9

## 2017-10-16 NOTE — Progress Notes (Signed)
Primary Care Physician: Seward Carol, MD   Primary Electrophysiologist: Rayann Heman  Brandi Griffin is a 83 y.o. female with a history of recently diagnosed paroxysmal atrial fibrillation who presents for follow up in the [facility].  Since last being seen in clinic, the patient reports doing very well.  Today, she denies symptoms of palpitations, chest pain, shortness of breath, orthopnea, PND, lower extremity edema, dizziness, presyncope, syncope, snoring, daytime somnolence, bleeding, or neurologic sequela. The patient is tolerating medications without difficulties and is otherwise without complaint today.  She is tolerating eliquis without bleeding.   Atrial Fibrillation Risk Factors:  she does not have symptoms or diagnosis of sleep apnea.  she does not have a history of rheumatic fever.  she does not have a history of alcohol use.  she has a BMI of Body mass index is 26.38 kg/m.Marland Kitchen Filed Weights   10/16/17 0903  Weight: 139 lb 9.6 oz (63.3 kg)    LA size: 36 mm   Atrial Fibrillation Management history:  Previous antiarrhythmic drugs: none  Previous cardioversions: none  Previous ablations: none  CHADS2VASC score: 7  Anticoagulation history: eliquis   Past Medical History:  Diagnosis Date  . Cancer ([place])   . Diabetes mellitus without complication ([place])   . Hypertension   . Paroxysmal atrial fibrillation (HCC)   . Stroke (cerebrum) [facility])    Past Surgical History:  Procedure Laterality Date  . ABDOMINAL HYSTERECTOMY      Current Outpatient Medications  Medication Sig Dispense Refill  . acetaminophen (TYLENOL) 325 MG tablet Take 2 tablets (650 mg total) by mouth every 4 (four) hours as needed for mild pain (or temp > 37.5 C (99.5 F)).    Marland Kitchen atorvastatin (LIPITOR) 80 MG tablet Take 1 tablet (80 mg total) by mouth daily at 6 PM. 60 tablet 0  . BD ULTRA-FINE LANCETS lancets by Other route. Use as instructed    . ELIQUIS 5 MG TABS  tablet 1 tablet 2 (two) times daily.    . Glucose Blood (FREESTYLE LITE TEST VI) by In Vitro route.    Marland Kitchen glucose blood test strip 1 each by Other route as needed for other. Use as instructed    . hydrochlorothiazide (HYDRODIURIL) 25 MG tablet Take 25 mg by mouth daily.    Marland Kitchen lisinopril (PRINIVIL,ZESTRIL) 20 MG tablet Take 20 mg by mouth daily.    . metFORMIN (GLUCOPHAGE) 1000 MG tablet TK 2 TS PO QD  1  . metoprolol tartrate (LOPRESSOR) 25 MG tablet Take 0.5 tablets (12.5 mg total) by mouth 2 (two) times daily. 30 tablet 2  . pioglitazone (ACTOS) 30 MG tablet TK 1 T PO QD  3  . potassium chloride SA (K-DUR,KLOR-CON) 20 MEQ tablet Take 2 tablets (40 mEq total) by mouth daily. 30 tablet 0  . rivastigmine (EXELON) 1.5 MG capsule Take 1 capsule (1.5 mg total) by mouth 2 (two) times daily. 60 capsule 11   No current facility-administered medications for this encounter.     No Known Allergies  Social History   Socioeconomic History  . Marital status: Divorced    Spouse name: Not on file  . Number of children: 1  . Years of education: college  . Highest education level: Not on file  Occupational History  . Occupation: retired  Scientific laboratory technician  . Financial resource strain: Not on file  . Food insecurity:    Worry: Not on file    Inability: Not on file  .  Transportation needs:    Medical: Not on file    Non-medical: Not on file  Tobacco Use  . Smoking status: Never Smoker  . Smokeless tobacco: Never Used  Substance and Sexual Activity  . Alcohol use: No  . Drug use: No  . Sexual activity: Not on file  Lifestyle  . Physical activity:    Days per week: Not on file    Minutes per session: Not on file  . Stress: Not on file  Relationships  . Social connections:    Talks on phone: Not on file    Gets together: Not on file    Attends religious service: Not on file    Active member of club or organization: Not on file    Attends meetings of clubs or organizations: Not on file     Relationship status: Not on file  . Intimate partner violence:    Fear of current or ex partner: Not on file    Emotionally abused: Not on file    Physically abused: Not on file    Forced sexual activity: Not on file  Other Topics Concern  . Not on file  Social History Narrative   Lives with daughter in a 2 story home.  Has one daughter.  Retired Animal nutritionist.  Education: college.    Family History  Problem Relation Age of Onset  . Hypertension Mother   . Stroke Mother     ROS- All systems are reviewed and negative except as per the HPI above.  Physical Exam: Vitals:   10/16/17 0903  BP: (!) 148/76  Pulse: (!) 49  Weight: 139 lb 9.6 oz (63.3 kg)  Height: 5\' 1"  (1.549 m)    GEN- The patient is elderly appearing, alert and oriented x 3 today.   Head- normocephalic, atraumatic Eyes-  Sclera clear, conjunctiva pink Ears- hearing intact Oropharynx- clear Neck- supple  Lungs- Clear to ausculation bilaterally, normal work of breathing Heart- Regular rate and rhythm, no murmurs, rubs or gallops  GI- soft, NT, ND, + BS Extremities- no clubbing, cyanosis, or edema MS- no significant deformity or atrophy Skin- no rash or lesion Psych- euthymic mood, full affect Neuro- strength and sensation are intact  Wt Readings from Last 3 Encounters:  10/16/17 139 lb 9.6 oz (63.3 kg)  10/02/17 140 lb (63.5 kg)  06/04/17 142 lb (64.4 kg)    EKG today demonstrates sinus rhythm 49 bpm, PACs, PR 224 msec, incomplete RBBB, LAD Echo 02/28/2017 demonstrated normal EF, diastolic dysfunction  Epic records are reviewed at length today  Assessment and Plan:  1. Paroxysmal atrial fibrillation She continues to wear an even monitor Started on eliquis asymptomatic Continue eliquis for CHADS2VASC of 7 No changes today  2. HTN Stable No change required today  Return in 2 months  Thompson Grayer, MD 10/16/2017 9:24 AM

## 2017-11-07 IMAGING — CR DG HIP (WITH OR WITHOUT PELVIS) 2-3V*R*
2 series · 2 of 2 positions shown · non-contrast
Comparison: None.

CLINICAL DATA: Chronic right hip pain without injury.

EXAM:
DG HIP (WITH OR WITHOUT PELVIS) 2-3V RIGHT

[w pelvis]
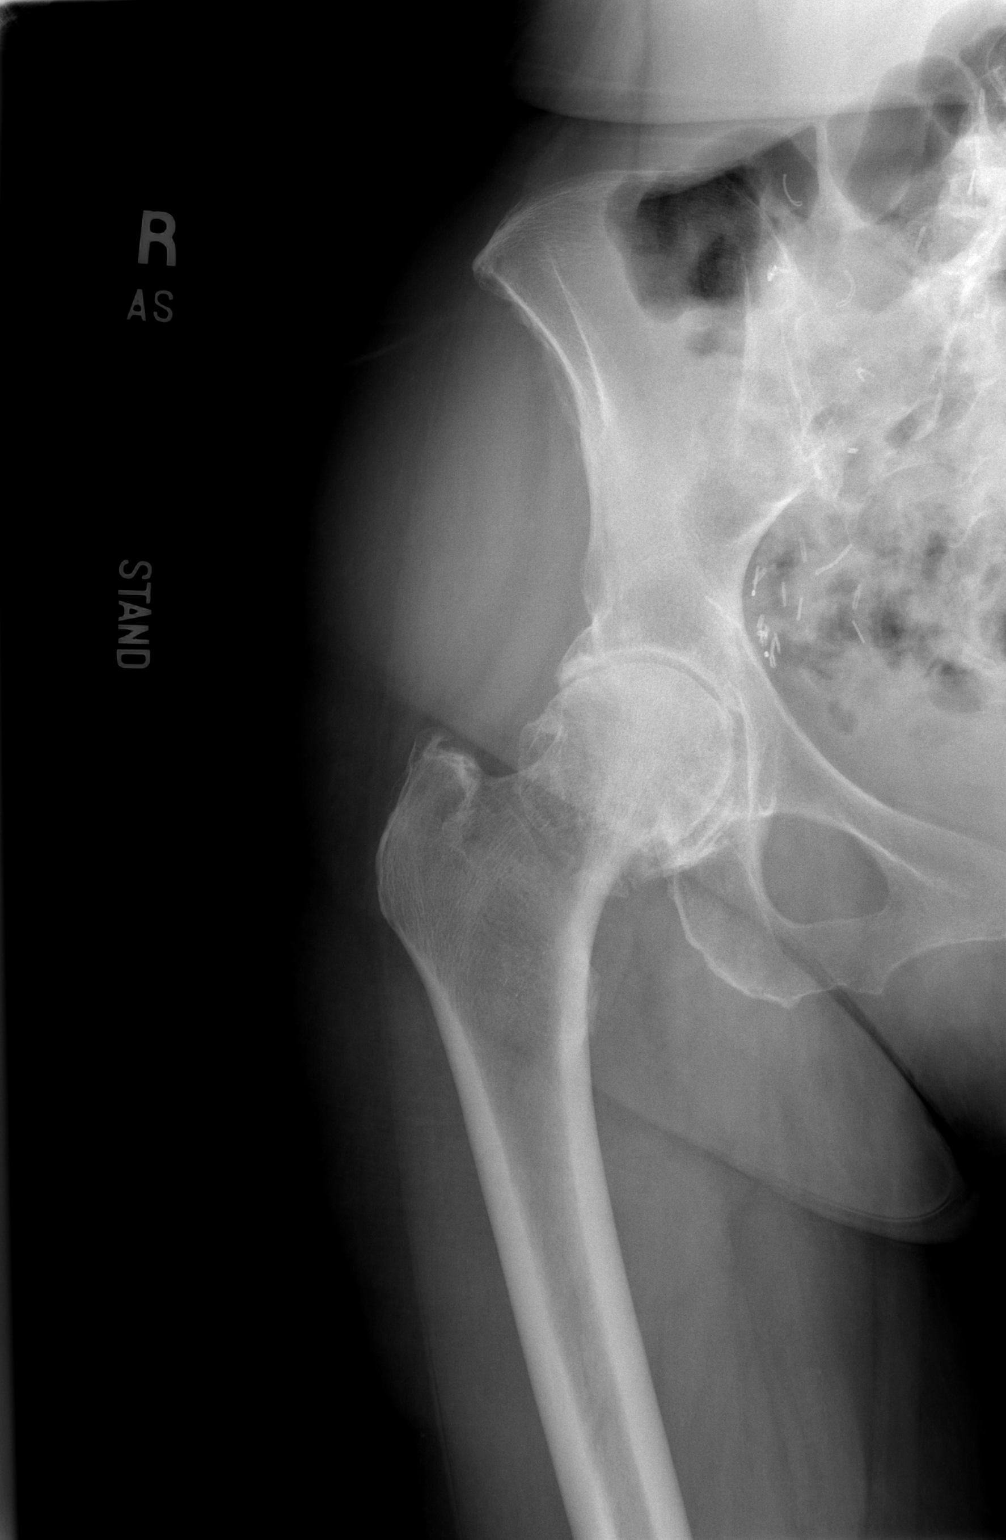

[t hip frog leg right]
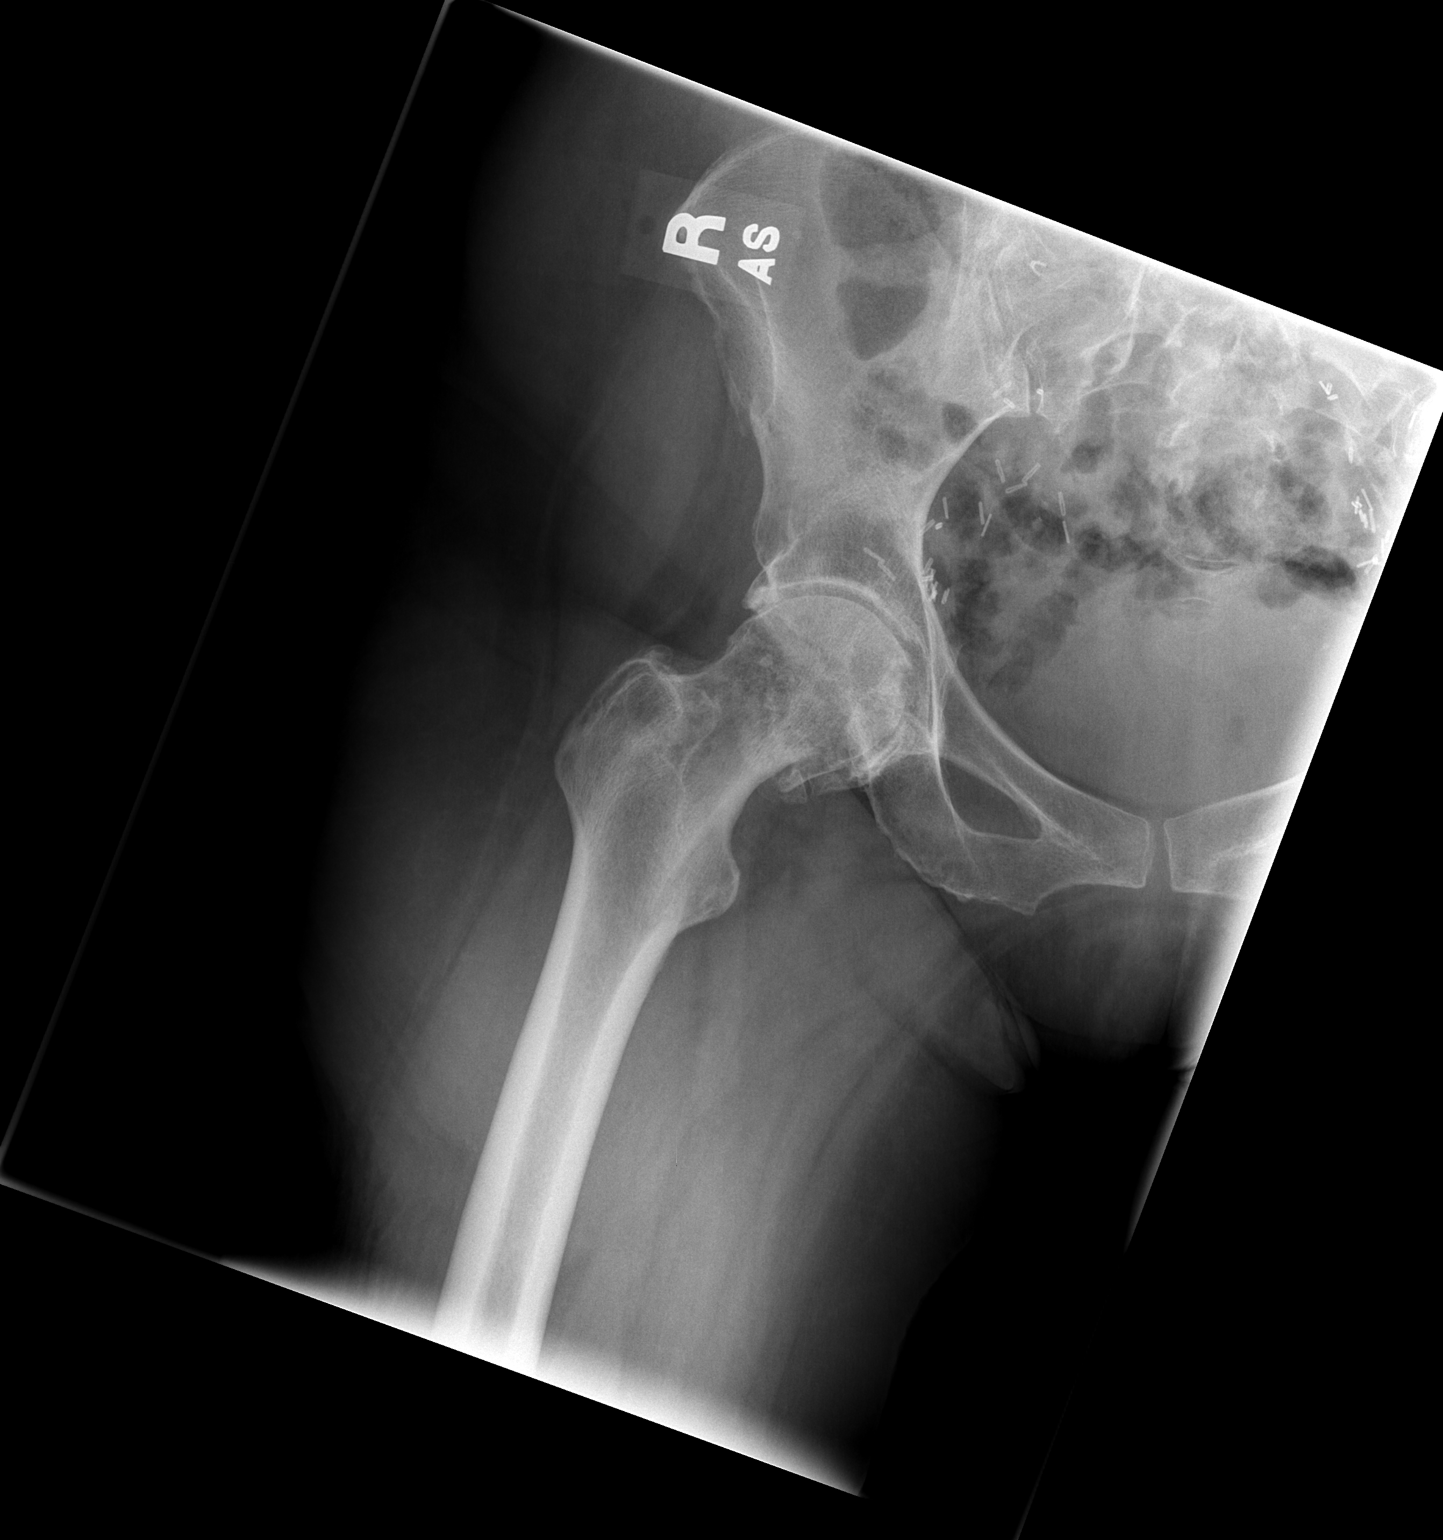

[2 of 2 positions shown; findings below may reference images not displayed]

FINDINGS: Two view exam of the right hip shows loss of joint space with
subchondral sclerosis and advanced hypertrophic spurring.
IMPRESSION: Marked degenerative changes without acute bony abnormality.

## 2017-12-03 ENCOUNTER — Other Ambulatory Visit: Payer: Self-pay

## 2017-12-03 ENCOUNTER — Encounter: Payer: Self-pay | Admitting: Neurology

## 2017-12-03 ENCOUNTER — Ambulatory Visit (INDEPENDENT_AMBULATORY_CARE_PROVIDER_SITE_OTHER): Payer: Medicare Other | Admitting: Neurology

## 2017-12-03 VITALS — BP 224/88 | HR 75 | Ht 61.0 in | Wt 138.0 lb

## 2017-12-03 DIAGNOSIS — F0390 Unspecified dementia without behavioral disturbance: Secondary | ICD-10-CM

## 2017-12-03 DIAGNOSIS — I6349 Cerebral infarction due to embolism of other cerebral artery: Secondary | ICD-10-CM

## 2017-12-03 DIAGNOSIS — F03A0 Unspecified dementia, mild, without behavioral disturbance, psychotic disturbance, mood disturbance, and anxiety: Secondary | ICD-10-CM

## 2017-12-03 MED ORDER — RIVASTIGMINE TARTRATE 3 MG PO CAPS: 3.0000 mg | ORAL_CAPSULE | Freq: Two times a day (BID) | ORAL | 3 refills | Status: DC

## 2017-12-03 NOTE — Progress Notes (Signed)
NEUROLOGY FOLLOW UP OFFICE NOTE  Brandi Griffin [id number] 1934-11-15  HISTORY OF PRESENT ILLNESS: I had the pleasure of seeing Brandi Griffin in follow-up in the neurology clinic on 12/03/2017.  The patient was last seen 6 months ago for mild dementia and stroke in November 2018. She is again accompanied by her daughter who helps supplement the history today. On her last visit, they reported side effects on Aricept, and was started on low dose Exelon 1.5mg  BID. MMSE in February 2019 was 26/30. Since her last visit, she reports feeling fine. Her daughter administers medications and manages finances. She states she does not take Exelon, her daughter reminds her that she does, no side effects. She does not drive. She is independent with dressing and bathing. Daughter denies any personality changes, paranoia, or hallucinations. She denies any headaches, dizziness, vision changes, focal numbness/tingling/weakness, no falls. No further episodes of gibberish speech, BP has been better. She was found to have atrial fibrillation on holter monitor, now on Eliquis.  PAST MEDICAL HISTORY: Past Medical History:  Diagnosis Date  . Cancer ([place])   . Diabetes mellitus without complication ([place])   . Hypertension   . Paroxysmal atrial fibrillation (HCC)   . Stroke (cerebrum) [facility])     MEDICATIONS: Current Outpatient Medications on File Prior to Visit  Medication Sig Dispense Refill  . acetaminophen (TYLENOL) 325 MG tablet Take 2 tablets (650 mg total) by mouth every 4 (four) hours as needed for mild pain (or temp > 37.5 C (99.5 F)).    Marland Kitchen atorvastatin (LIPITOR) 80 MG tablet Take 1 tablet (80 mg total) by mouth daily at 6 PM. 60 tablet 0  . BD ULTRA-FINE LANCETS lancets by Other route. Use as instructed    . ELIQUIS 5 MG TABS tablet 1 tablet 2 (two) times daily.    . Glucose Blood (FREESTYLE LITE TEST VI) by In Vitro route.    Marland Kitchen glucose blood test strip 1 each by Other route as needed for other. Use as  instructed    . hydrochlorothiazide (HYDRODIURIL) 25 MG tablet Take 25 mg by mouth daily.    Marland Kitchen lisinopril (PRINIVIL,ZESTRIL) 20 MG tablet Take 20 mg by mouth daily.    . metFORMIN (GLUCOPHAGE) 1000 MG tablet TK 2 TS PO QD  1  . metoprolol tartrate (LOPRESSOR) 25 MG tablet Take 0.5 tablets (12.5 mg total) by mouth 2 (two) times daily. 30 tablet 2  . pioglitazone (ACTOS) 30 MG tablet TK 1 T PO QD  3  . potassium chloride SA (K-DUR,KLOR-CON) 20 MEQ tablet Take 2 tablets (40 mEq total) by mouth daily. 30 tablet 0  . rivastigmine (EXELON) 1.5 MG capsule Take 1 capsule (1.5 mg total) by mouth 2 (two) times daily. 60 capsule 11   No current facility-administered medications on file prior to visit.     ALLERGIES: No Known Allergies  FAMILY HISTORY: Family History  Problem Relation Age of Onset  . Hypertension Mother   . Stroke Mother     SOCIAL HISTORY: Social History   Socioeconomic History  . Marital status: Divorced    Spouse name: Not on file  . Number of children: 1  . Years of education: college  . Highest education level: Not on file  Occupational History  . Occupation: retired  Scientific laboratory technician  . Financial resource strain: Not on file  . Food insecurity:    Worry: Not on file    Inability: Not on file  . Transportation needs:  Medical: Not on file    Non-medical: Not on file  Tobacco Use  . Smoking status: Never Smoker  . Smokeless tobacco: Never Used  Substance and Sexual Activity  . Alcohol use: No  . Drug use: No  . Sexual activity: Not on file  Lifestyle  . Physical activity:    Days per week: Not on file    Minutes per session: Not on file  . Stress: Not on file  Relationships  . Social connections:    Talks on phone: Not on file    Gets together: Not on file    Attends religious service: Not on file    Active member of club or organization: Not on file    Attends meetings of clubs or organizations: Not on file    Relationship status: Not on file  .  Intimate partner violence:    Fear of current or ex partner: Not on file    Emotionally abused: Not on file    Physically abused: Not on file    Forced sexual activity: Not on file  Other Topics Concern  . Not on file  Social History Narrative   Lives with daughter in a 2 story home.  Has one daughter.  Retired Animal nutritionist.  Education: college.    REVIEW OF SYSTEMS: Constitutional: No fevers, chills, or sweats, no generalized fatigue, change in appetite Eyes: No visual changes, double vision, eye pain Ear, nose and throat: No hearing loss, ear pain, nasal congestion, sore throat Cardiovascular: No chest pain, palpitations Respiratory:  No shortness of breath at rest or with exertion, wheezes GastrointestinaI: No nausea, vomiting, diarrhea, abdominal pain, fecal incontinence Genitourinary:  No dysuria, urinary retention or frequency Musculoskeletal:  No neck pain, back pain Integumentary: No rash, pruritus, skin lesions Neurological: as above Psychiatric: No depression, insomnia, anxiety Endocrine: No palpitations, fatigue, diaphoresis, mood swings, change in appetite, change in weight, increased thirst Hematologic/Lymphatic:  No anemia, purpura, petechiae. Allergic/Immunologic: no itchy/runny eyes, nasal congestion, recent allergic reactions, rashes  PHYSICAL EXAM: Vitals:   12/03/17 1449  BP: (!) 224/88  Pulse: 75  SpO2: 96%   General: No acute distress Head:  Normocephalic/atraumatic Neck: supple, no paraspinal tenderness, full range of motion Heart:  Regular rate and rhythm Lungs:  Clear to auscultation bilaterally Back: No paraspinal tenderness Skin/Extremities: No rash, no edema Neurological Exam: alert and oriented to person, place, and time. No aphasia or dysarthria. Fund of knowledge is appropriate.  Recent and remote memory are impaired. 0/3 delayed recall.  Attention and concentration are normal.    Able to name objects and repeat phrases. Cranial nerves:  Pupils equal, round, reactive to light.  Extraocular movements intact with no nystagmus. Visual fields full. Facial sensation intact. No facial asymmetry. Tongue, uvula, palate midline.  Motor: Bulk and tone normal, muscle strength 5/5 throughout with no pronator drift.  Sensation to light touch intact.  No extinction to double simultaneous stimulation.  Deep tendon reflexes 2+ throughout, toes downgoing.  Finger to nose testing intact.  Gait narrow-based and steady, mild difficulty with tandem walk but able (similar to prior). Romberg negative.  IMPRESSION: This is a pleasant 83 yo RH woman with vascular risk factors including hypertension, hyperlipidemia, diabetes, and mild dementia, who had microembolic strokes in multiple vascular territories last November 2018, atrial fibrillation found on holter, now on Eliquis. She does not have any residual deficits from the strokes. She is on a low dose of Exelon, increase to 3mg  BID. We again discussed the importance  of control of vascular risk factors for secondary stroke prevention, they know to go to the ER immediately for any change in symptoms. She will follow-up in 6 months and knows to call for any questions/concerns.  Thank you for allowing me to participate in her care.  Please do not hesitate to call for any questions or concerns.  The duration of this appointment visit was 25 minutes of face-to-face time with the patient.  Greater than 50% of this time was spent in counseling, explanation of diagnosis, planning of further management, and coordination of care.   Ellouise Newer, M.D.   CC: Dr. Delfina Redwood

## 2017-12-03 NOTE — Patient Instructions (Addendum)
1. Increase Rivastigmine (Exelon) to 3mg  twice a day. With your current bottle of Exelon 1.5mg , take 2 tablets twice a day. Once finished, your new bottle will be for Exelon 3mg  twice a day  2. It is very important to take your medications regularly to control blood pressure, cholesterol, and diabetes, as well as blood thinner, to help prevent further stroke  3. Go to ER immediately for any sudden change in symptoms  4. Follow-up in 6 months, call for any changes

## 2017-12-11 ENCOUNTER — Encounter: Payer: Self-pay | Admitting: Neurology

## 2017-12-16 ENCOUNTER — Encounter (HOSPITAL_COMMUNITY): Payer: Self-pay | Admitting: Nurse Practitioner

## 2017-12-16 ENCOUNTER — Ambulatory Visit (HOSPITAL_COMMUNITY)
Admission: RE | Admit: 2017-12-16 | Discharge: 2017-12-16 | Disposition: A | Discharge status: Home / Self Care | Payer: Medicare Other | Source: Ambulatory Visit | Attending: Nurse Practitioner | Admitting: Nurse Practitioner

## 2017-12-16 VITALS — BP 138/82 | HR 75 | Ht 61.0 in | Wt 126.8 lb

## 2017-12-16 DIAGNOSIS — E119 Type 2 diabetes mellitus without complications: Secondary | ICD-10-CM | POA: Insufficient documentation

## 2017-12-16 DIAGNOSIS — Z7901 Long term (current) use of anticoagulants: Secondary | ICD-10-CM | POA: Insufficient documentation

## 2017-12-16 DIAGNOSIS — Z8673 Personal history of transient ischemic attack (TIA), and cerebral infarction without residual deficits: Secondary | ICD-10-CM | POA: Diagnosis not present

## 2017-12-16 DIAGNOSIS — Z823 Family history of stroke: Secondary | ICD-10-CM | POA: Insufficient documentation

## 2017-12-16 DIAGNOSIS — I480 Paroxysmal atrial fibrillation: Secondary | ICD-10-CM

## 2017-12-16 DIAGNOSIS — Z79899 Other long term (current) drug therapy: Secondary | ICD-10-CM | POA: Diagnosis not present

## 2017-12-16 DIAGNOSIS — Z8249 Family history of ischemic heart disease and other diseases of the circulatory system: Secondary | ICD-10-CM | POA: Diagnosis not present

## 2017-12-16 DIAGNOSIS — Z7984 Long term (current) use of oral hypoglycemic drugs: Secondary | ICD-10-CM | POA: Diagnosis not present

## 2017-12-16 DIAGNOSIS — I10 Essential (primary) hypertension: Secondary | ICD-10-CM | POA: Insufficient documentation

## 2017-12-16 NOTE — Progress Notes (Signed)
Primary Care Physician: Seward Carol, MD   Primary Electrophysiologist: Rayann Heman  Brandi Griffin is a 83 y.o. female with a history of recently diagnosed paroxysmal atrial fibrillation who presents for follow up in the [facility].  Since last being seen in clinic, the patient reports doing very well.  No bleeding issues with eliquis, no awareness of afib.   Today, she denies symptoms of palpitations, chest pain, shortness of breath, orthopnea, PND, lower extremity edema, dizziness, presyncope, syncope, snoring, daytime somnolence, bleeding, or neurologic sequela. The patient is tolerating medications without difficulties and is otherwise without complaint today.  She is tolerating eliquis without bleeding.   Atrial Fibrillation Risk Factors:  she does not have symptoms or diagnosis of sleep apnea.  she does not have a history of rheumatic fever.  she does not have a history of alcohol use.  she has a BMI of Body mass index is 23.96 kg/m.Marland Kitchen Filed Weights   12/16/17 0857  Weight: 57.5 kg    LA size: 36 mm   Atrial Fibrillation Management history:  Previous antiarrhythmic drugs: none  Previous cardioversions: none  Previous ablations: none  CHADS2VASC score: 7  Anticoagulation history: eliquis   Past Medical History:  Diagnosis Date  . Cancer ([place])   . Diabetes mellitus without complication ([place])   . Hypertension   . Paroxysmal atrial fibrillation (HCC)   . Stroke (cerebrum) [facility])    Past Surgical History:  Procedure Laterality Date  . ABDOMINAL HYSTERECTOMY      Current Outpatient Medications  Medication Sig Dispense Refill  . acetaminophen (TYLENOL) 325 MG tablet Take 2 tablets (650 mg total) by mouth every 4 (four) hours as needed for mild pain (or temp > 37.5 C (99.5 F)).    Marland Kitchen atorvastatin (LIPITOR) 80 MG tablet Take 1 tablet (80 mg total) by mouth daily at 6 PM. 60 tablet 0  . ELIQUIS 5 MG TABS tablet 1 tablet 2 (two)  times daily.    . hydrochlorothiazide (HYDRODIURIL) 25 MG tablet Take 25 mg by mouth daily.    Marland Kitchen lisinopril (PRINIVIL,ZESTRIL) 20 MG tablet Take 20 mg by mouth daily.    . metFORMIN (GLUCOPHAGE) 1000 MG tablet TK 2 TS PO QD  1  . metoprolol tartrate (LOPRESSOR) 25 MG tablet Take 0.5 tablets (12.5 mg total) by mouth 2 (two) times daily. 30 tablet 2  . pioglitazone (ACTOS) 30 MG tablet TK 1 T PO QD  3  . potassium chloride SA (K-DUR,KLOR-CON) 20 MEQ tablet Take 2 tablets (40 mEq total) by mouth daily. 30 tablet 0  . rivastigmine (EXELON) 3 MG capsule Take 1 capsule (3 mg total) by mouth 2 (two) times daily. 180 capsule 3   No current facility-administered medications for this encounter.     No Known Allergies  Social History   Socioeconomic History  . Marital status: Divorced    Spouse name: Not on file  . Number of children: 1  . Years of education: college  . Highest education level: Not on file  Occupational History  . Occupation: retired  Scientific laboratory technician  . Financial resource strain: Not on file  . Food insecurity:    Worry: Not on file    Inability: Not on file  . Transportation needs:    Medical: Not on file    Non-medical: Not on file  Tobacco Use  . Smoking status: Never Smoker  . Smokeless tobacco: Never Used  Substance and Sexual Activity  . Alcohol use:  No  . Drug use: No  . Sexual activity: Not on file  Lifestyle  . Physical activity:    Days per week: Not on file    Minutes per session: Not on file  . Stress: Not on file  Relationships  . Social connections:    Talks on phone: Not on file    Gets together: Not on file    Attends religious service: Not on file    Active member of club or organization: Not on file    Attends meetings of clubs or organizations: Not on file    Relationship status: Not on file  . Intimate partner violence:    Fear of current or ex partner: Not on file    Emotionally abused: Not on file    Physically abused: Not on file     Forced sexual activity: Not on file  Other Topics Concern  . Not on file  Social History Narrative   Lives with daughter in a 2 story home.  Has one daughter.  Retired Animal nutritionist.  Education: college.    Family History  Problem Relation Age of Onset  . Hypertension Mother   . Stroke Mother     ROS- All systems are reviewed and negative except as per the HPI above.  Physical Exam: Vitals:   12/16/17 0857  BP: 138/82  Pulse: 75  Weight: 57.5 kg  Height: 5\' 1"  (1.549 m)    GEN- The patient is elderly appearing, alert and oriented x 3 today.   Head- normocephalic, atraumatic Eyes-  Sclera clear, conjunctiva pink Ears- hearing intact Oropharynx- clear Neck- supple  Lungs- Clear to ausculation bilaterally, normal work of breathing Heart- Regular rate and rhythm, no murmurs, rubs or gallops  GI- soft, NT, ND, + BS Extremities- no clubbing, cyanosis, or edema MS- no significant deformity or atrophy Skin- no rash or lesion Psych- euthymic mood, full affect Neuro- strength and sensation are intact  Wt Readings from Last 3 Encounters:  12/16/17 57.5 kg  12/03/17 62.6 kg  10/16/17 63.3 kg    EKG today demonstrates sinus rhythm at 75 bpm, PR int 202 ms, qrs int 86 ms, qtc 464 ms Echo 02/28/2017 demonstrated normal EF, diastolic dysfunction  Epic records are reviewed at length today  Assessment and Plan:  1. Paroxysmal atrial fibrillation Asymptomatic Continue eliquis for CHADS2VASC of 7 No changes today  2. HTN Stable No change required today  Return in 9 months  Butch Penny C. Carroll, [facility] [address] [place], [place] [address] [phone]

## 2017-12-31 ENCOUNTER — Telehealth: Payer: Self-pay

## 2017-12-31 NOTE — Telephone Encounter (Signed)
Left a VM for Brandi Griffin to return my call in regards to the PREP at [facility].

## 2018-03-13 IMAGING — CT CT HEAD W/O CM
4 series · 16 of 47 positions shown, 18 images · non-contrast
Comparison: MRI of the brain performed 10/29/2016

CLINICAL DATA: Acute onset of altered level of consciousness.
Initial encounter.

EXAM:
CT HEAD WITHOUT CONTRAST
TECHNIQUE: Contiguous axial images were obtained from the base of the skull
through the vertex without intravenous contrast.

[Series 3: head wo · axial · 0.41mm/px · z∈[-150,-30]mm · 7 of 33 slices shown, 9 images]
[im 5/33  brain]
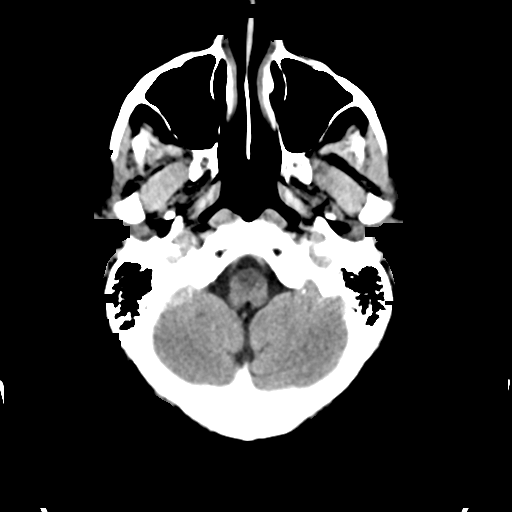
[im 5/33  bone]
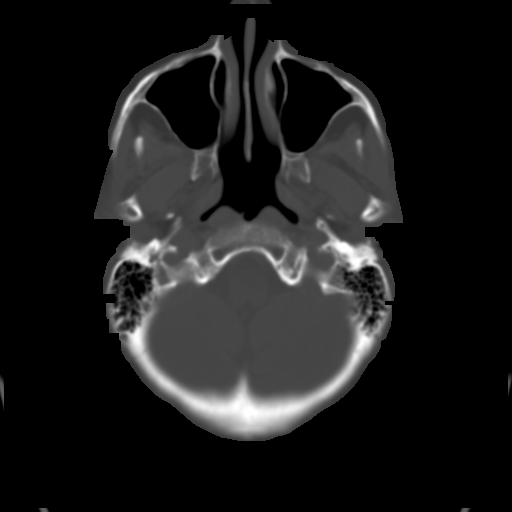
[im 9/33  brain]
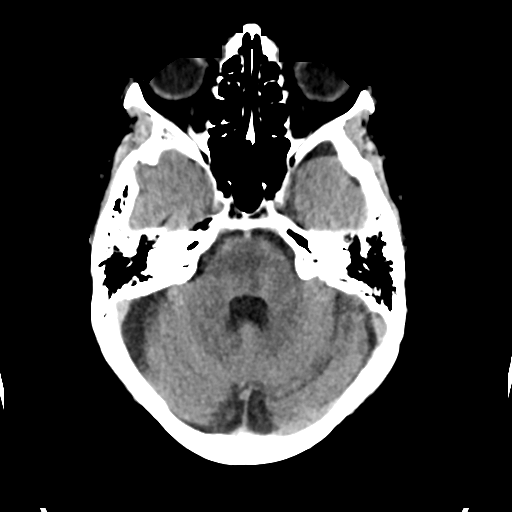
[im 13/33  brain]
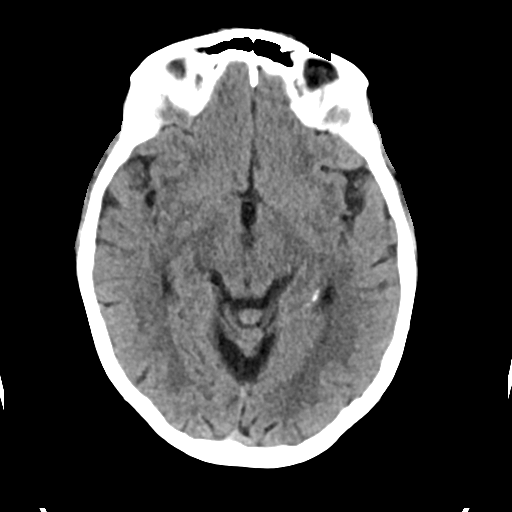
[im 17/33  brain]
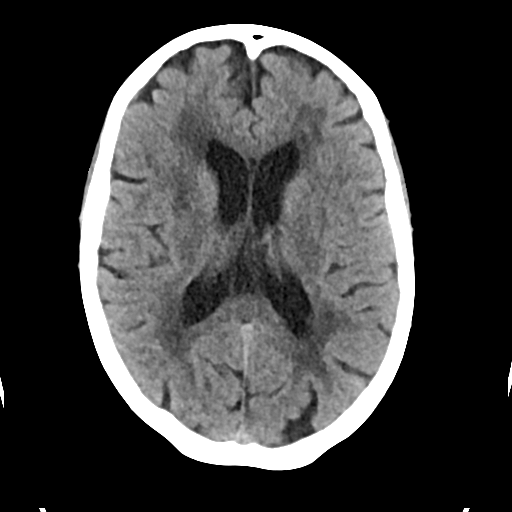
[im 21/33  brain]
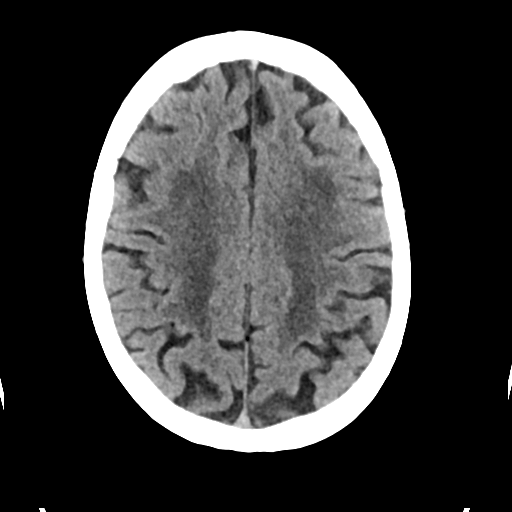
[im 21/33  bone]
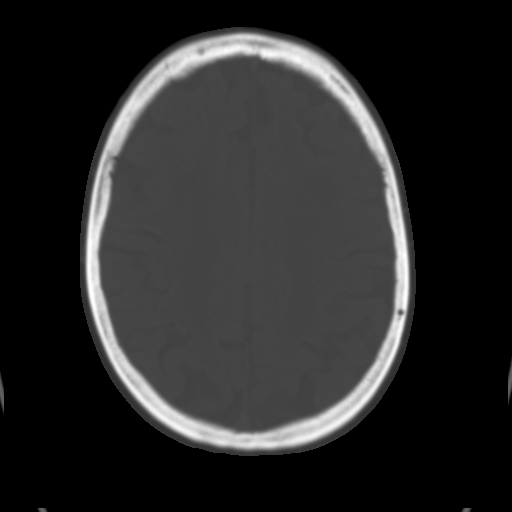
[im 25/33  brain]
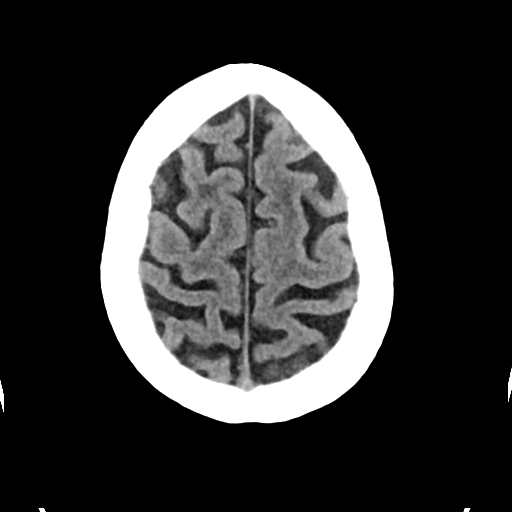
[im 29/33  brain]
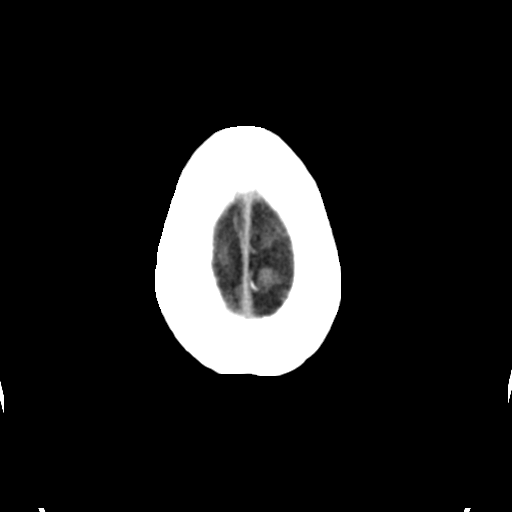

[Series 4: head bone · axial · 0.41mm/px · z∈[-154,-122]mm · 3 of 83 slices shown]
[im 9/83  bone]
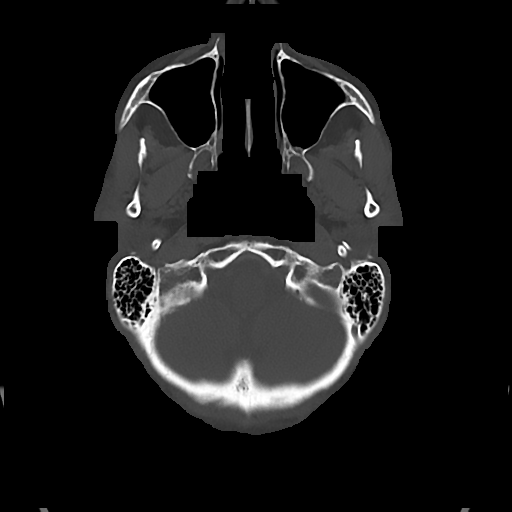
[im 17/83  bone]
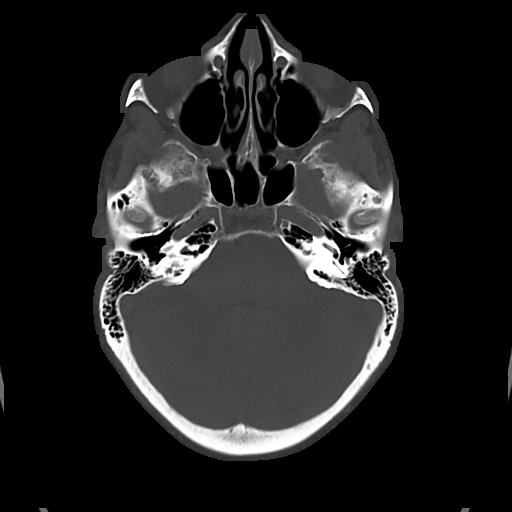
[im 25/83  bone]
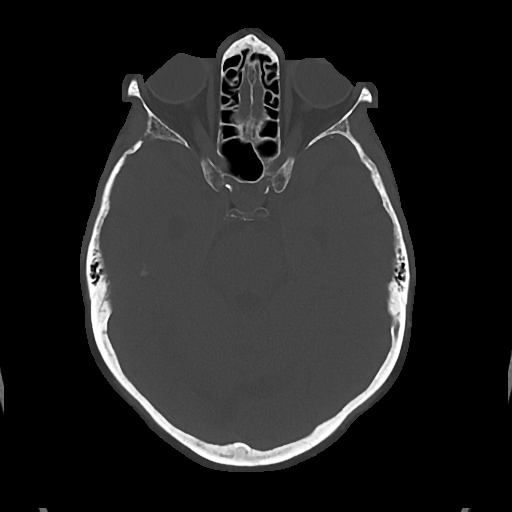

[Series 5: cor soft · coronal · 0.33mm/px · 3 of 68 slices shown]
[im 23/68  brain]
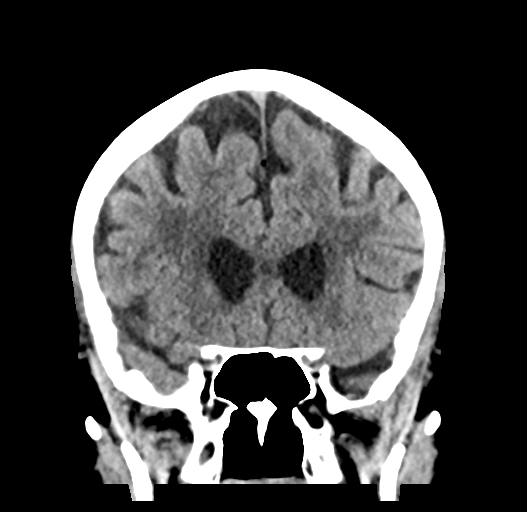
[im 30/68  brain]
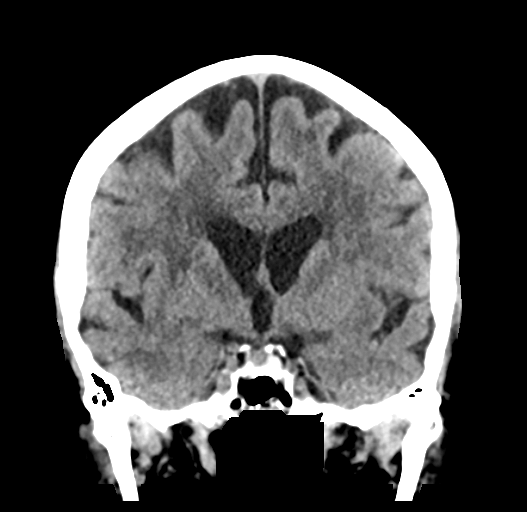
[im 38/68  brain]
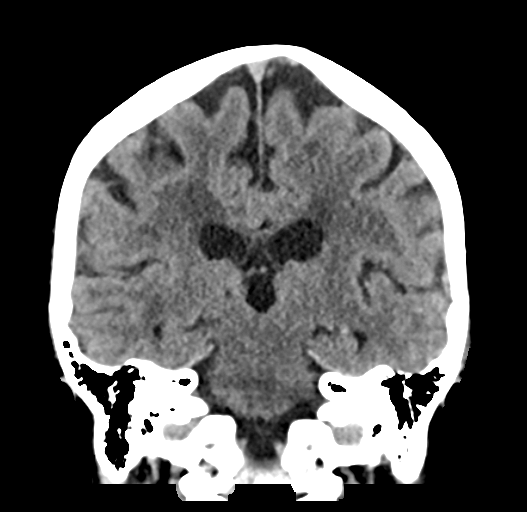

[Series 6: sag soft · sagittal · 0.34mm/px · 3 of 54 slices shown]
[im 18/54  brain]
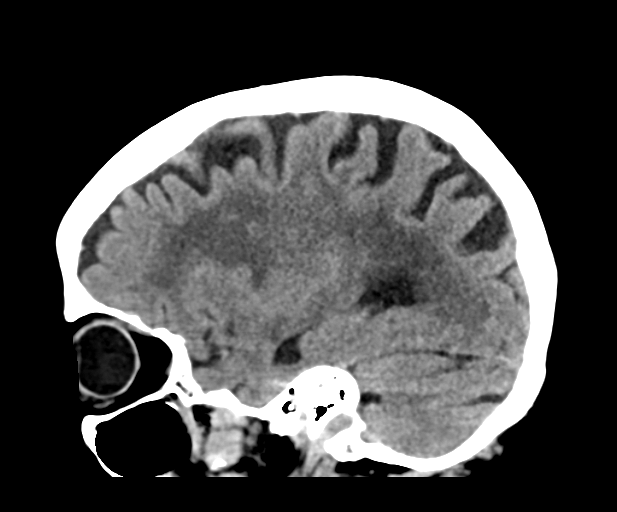
[im 27/54  brain]
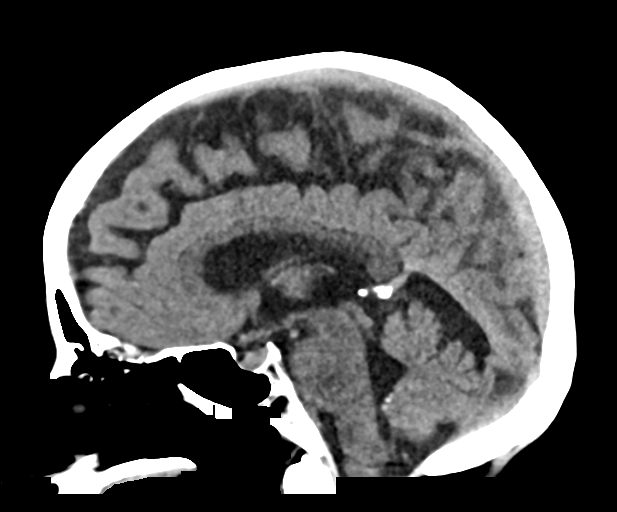
[im 36/54  brain]
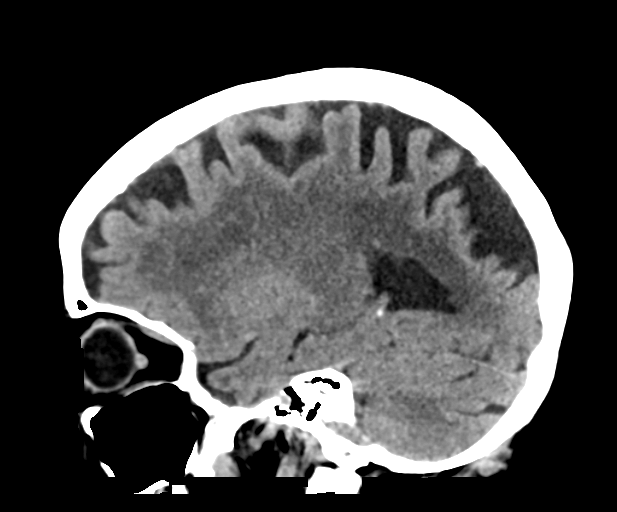

[16 of 47 positions shown; findings below may reference images not displayed]

FINDINGS: Brain: No evidence of acute infarction, hemorrhage, hydrocephalus,
extra-axial collection or mass lesion/mass effect.

Prominence of the ventricles and sulci reflects mild to moderate
cortical volume loss. Mild cerebellar atrophy is noted. Scattered
periventricular and subcortical matter change likely reflects small
vessel ischemic microangiopathy.

The brainstem and fourth ventricle are within normal limits. The
basal ganglia are unremarkable in appearance. The cerebral
hemispheres demonstrate grossly normal gray-white differentiation.
No mass effect or midline shift is seen.

Vascular: No hyperdense vessel or unexpected calcification.

Skull: There is no evidence of fracture; visualized osseous
structures are unremarkable in appearance.

Sinuses/Orbits: The visualized portions of the orbits are within
normal limits. The paranasal sinuses and mastoid air cells are
well-aerated.

Other: No significant soft tissue abnormalities are seen.
IMPRESSION: 1. No acute intracranial pathology seen on CT.
2. Mild to moderate cortical volume loss noted.
3. Scattered small vessel ischemic microangiopathy.

## 2018-05-27 DIAGNOSIS — M25532 Pain in left wrist: Secondary | ICD-10-CM | POA: Diagnosis not present

## 2018-05-27 DIAGNOSIS — M79642 Pain in left hand: Secondary | ICD-10-CM | POA: Diagnosis not present

## 2018-05-27 DIAGNOSIS — S62102A Fracture of unspecified carpal bone, left wrist, initial encounter for closed fracture: Secondary | ICD-10-CM | POA: Diagnosis not present

## 2018-05-27 DIAGNOSIS — E119 Type 2 diabetes mellitus without complications: Secondary | ICD-10-CM | POA: Diagnosis not present

## 2018-05-27 DIAGNOSIS — S6992XA Unspecified injury of left wrist, hand and finger(s), initial encounter: Secondary | ICD-10-CM | POA: Diagnosis not present

## 2018-07-20 ENCOUNTER — Telehealth: Payer: Self-pay

## 2018-07-20 NOTE — Telephone Encounter (Signed)
LMOM for pt's daughter, [name], advising that we are not currently seeing pt's in the office due to the COVID-19 pandemic.  Asked for return call to the office to verify email and telephone number.

## 2018-07-24 ENCOUNTER — Ambulatory Visit: Payer: Medicare Other | Admitting: Neurology

## 2018-08-03 ENCOUNTER — Encounter: Payer: Self-pay | Admitting: Neurology

## 2018-09-14 ENCOUNTER — Telehealth (HOSPITAL_COMMUNITY): Payer: Self-pay | Admitting: *Deleted

## 2018-09-14 NOTE — Telephone Encounter (Signed)
Cld and left message to convert 05/20 appt to virtual visit.  Asked for a call back to get this appt rescheduled.  2nd call

## 2018-09-16 ENCOUNTER — Ambulatory Visit (HOSPITAL_COMMUNITY)
Admission: RE | Admit: 2018-09-16 | Discharge: 2018-09-16 | Disposition: A | Discharge status: Home / Self Care | Payer: Medicare Other | Source: Ambulatory Visit | Attending: Nurse Practitioner | Admitting: Nurse Practitioner

## 2018-09-16 ENCOUNTER — Other Ambulatory Visit: Payer: Self-pay

## 2018-09-16 NOTE — Progress Notes (Signed)
Tried to contact pt for a virtual visit. There was no answer to my attempt or our office attempts prior to visit  to answer the phone

## 2018-11-11 ENCOUNTER — Other Ambulatory Visit: Payer: Self-pay

## 2018-11-11 ENCOUNTER — Other Ambulatory Visit: Payer: Self-pay | Admitting: Internal Medicine

## 2018-11-11 DIAGNOSIS — Z20822 Contact with and (suspected) exposure to covid-19: Secondary | ICD-10-CM

## 2018-11-16 LAB — NOVEL CORONAVIRUS, NAA: SARS-CoV-2, NAA: NOT DETECTED

## 2019-03-25 ENCOUNTER — Emergency Department (HOSPITAL_COMMUNITY): Payer: Medicare Other

## 2019-03-25 ENCOUNTER — Emergency Department (HOSPITAL_COMMUNITY)
Admission: EM | Admit: 2019-03-25 | Discharge: 2019-03-25 | Disposition: A | Discharge status: Home / Self Care | Payer: Medicare Other | Attending: Emergency Medicine | Admitting: Emergency Medicine

## 2019-03-25 ENCOUNTER — Encounter (HOSPITAL_COMMUNITY): Payer: Self-pay | Admitting: Obstetrics and Gynecology

## 2019-03-25 ENCOUNTER — Other Ambulatory Visit: Payer: Self-pay

## 2019-03-25 DIAGNOSIS — N179 Acute kidney failure, unspecified: Secondary | ICD-10-CM

## 2019-03-25 DIAGNOSIS — I5032 Chronic diastolic (congestive) heart failure: Secondary | ICD-10-CM | POA: Insufficient documentation

## 2019-03-25 DIAGNOSIS — Z79899 Other long term (current) drug therapy: Secondary | ICD-10-CM | POA: Insufficient documentation

## 2019-03-25 DIAGNOSIS — I110 Hypertensive heart disease with heart failure: Secondary | ICD-10-CM | POA: Diagnosis not present

## 2019-03-25 DIAGNOSIS — R739 Hyperglycemia, unspecified: Secondary | ICD-10-CM

## 2019-03-25 DIAGNOSIS — Z7901 Long term (current) use of anticoagulants: Secondary | ICD-10-CM | POA: Insufficient documentation

## 2019-03-25 DIAGNOSIS — Z7984 Long term (current) use of oral hypoglycemic drugs: Secondary | ICD-10-CM | POA: Insufficient documentation

## 2019-03-25 DIAGNOSIS — E1165 Type 2 diabetes mellitus with hyperglycemia: Secondary | ICD-10-CM | POA: Diagnosis not present

## 2019-03-25 DIAGNOSIS — R4182 Altered mental status, unspecified: Secondary | ICD-10-CM | POA: Diagnosis present

## 2019-03-25 DIAGNOSIS — F0390 Unspecified dementia without behavioral disturbance: Secondary | ICD-10-CM | POA: Insufficient documentation

## 2019-03-25 LAB — BASIC METABOLIC PANEL
Anion gap: 13 (ref 5–15)
BUN: 25 mg/dL — ABNORMAL HIGH (ref 8–23)
CO2: 26 mmol/L (ref 22–32)
Calcium: 9.2 mg/dL (ref 8.9–10.3)
Chloride: 99 mmol/L (ref 98–111)
Creatinine, Ser: 1.35 mg/dL — ABNORMAL HIGH (ref 0.44–1.00)
GFR calc Af Amer: 42 mL/min — ABNORMAL LOW (ref >60)
GFR calc non Af Amer: 36 mL/min — ABNORMAL LOW (ref >60)
Glucose, Bld: 459 mg/dL — ABNORMAL HIGH (ref 70–99)
Potassium: 4.3 mmol/L (ref 3.5–5.1)
Sodium: 138 mmol/L (ref 135–145)

## 2019-03-25 LAB — CBC WITH DIFFERENTIAL/PLATELET
Abs Immature Granulocytes: 0.02 10*3/uL (ref 0.00–0.07)
Basophils Absolute: 0 10*3/uL (ref 0.0–0.1)
Basophils Relative: 0 %
Eosinophils Absolute: 0.1 10*3/uL (ref 0.0–0.5)
Eosinophils Relative: 1 %
HCT: 44.4 % (ref 36.0–46.0)
Hemoglobin: 14.5 g/dL (ref 12.0–15.0)
Immature Granulocytes: 0 %
Lymphocytes Relative: 25 %
Lymphs Abs: 1.5 10*3/uL (ref 0.7–4.0)
MCH: 28.5 pg (ref 26.0–34.0)
MCHC: 32.7 g/dL (ref 30.0–36.0)
MCV: 87.2 fL (ref 80.0–100.0)
Monocytes Absolute: 0.5 10*3/uL (ref 0.1–1.0)
Monocytes Relative: 9 %
Neutro Abs: 4 10*3/uL (ref 1.7–7.7)
Neutrophils Relative %: 65 %
Platelets: 250 10*3/uL (ref 150–400)
RBC: 5.09 MIL/uL (ref 3.87–5.11)
RDW: 13.2 % (ref 11.5–15.5)
WBC: 6.1 10*3/uL (ref 4.0–10.5)
nRBC: 0 % (ref 0.0–0.2)

## 2019-03-25 LAB — URINALYSIS, ROUTINE W REFLEX MICROSCOPIC
Bilirubin Urine: NEGATIVE
Glucose, UA: >=500 mg/dL — AB
Hgb urine dipstick: NEGATIVE
Ketones, ur: NEGATIVE mg/dL
Leukocytes,Ua: NEGATIVE
Nitrite: NEGATIVE
Protein, ur: NEGATIVE mg/dL
Specific Gravity, Urine: 1.016 (ref 1.005–1.030)
pH: 6 (ref 5.0–8.0)

## 2019-03-25 LAB — CBG MONITORING, ED
Glucose-Capillary: 429 mg/dL — ABNORMAL HIGH (ref 70–99)
Glucose-Capillary: 370 mg/dL — ABNORMAL HIGH (ref 70–99)

## 2019-03-25 MED ORDER — SODIUM CHLORIDE 0.9 % IV BOLUS
1000.0000 mL | Freq: Once | INTRAVENOUS | Status: AC
  Administered 2019-03-25: 1000 mL via INTRAVENOUS

## 2019-03-25 MED ORDER — METFORMIN HCL 500 MG PO TABS
500.0000 mg | ORAL_TABLET | Freq: Once | ORAL | Status: AC
  Administered 2019-03-25: 14:00:00 500 mg via ORAL
  Filled 2019-03-25: qty 1

## 2019-03-25 MED ORDER — METFORMIN HCL 500 MG PO TABS: 500.0000 mg | ORAL_TABLET | Freq: Two times a day (BID) | ORAL | 0 refills | Status: AC

## 2019-03-25 NOTE — ED Provider Notes (Signed)
[facility] DEPT Provider Note   CSN: [id number] Arrival date & time: 03/25/19  1000    History   Chief Complaint Chief Complaint  Patient presents with  . Altered Mental Status   HPI Brandi Griffin is a 84 y.o. female with medical history significant for CVA, atrial fibrillation on Eliquis, hypertension, diabetes, dementia who presents for evaluation of altered mental status.  38 of history taken from patient's daughter has history of dementia.  Patient does state "I feel fine."  Patient's daughter states that over the last 3 days patient has begun more confused.  She has not wanted to take her medications, primarily her metformin. Daughter states that this medication is "too large." And is wondering about a smaller capsule.  Patient did have a fall which was witnessed by daughter.  She did not hit her head at that time.  Has been ambulatory without difficulty and tolerating PO intake.  Patient's daughter states she has also had polyuria and polydipsia and she has not been taking her Metformin. Daughter states its "a fight everyday to get her to take her medications." She does have a follow-up appointment with PCP on Monday.  No prior history of UTIs.   Level 5 caveat- Dementia     HPI  Past Medical History:  Diagnosis Date  . Cancer ([place])   . Diabetes mellitus without complication ([place])   . Hypertension   . Paroxysmal atrial fibrillation (H[facility])   . Stroke (cerebrum) [facility])     Patient Active Problem List   Diagnosis Date Noted  . Sundowning 03/02/2017  . Chronic diastolic CHF (congestive heart failure) ([place]) 03/02/2017  . Hypokalemia 03/02/2017  . DM (diabetes mellitus), type 2 ([place]) 03/02/2017  . Acute embolic stroke ([place])   . Essential hypertension   . Mild dementia ([place]) 09/11/2016    Past Surgical History:  Procedure Laterality Date  . ABDOMINAL HYSTERECTOMY       OB History   No obstetric history on file.      Home  Medications    Prior to Admission medications   Medication Sig Start Date End Date Taking? Authorizing Provider  acetaminophen (TYLENOL) 325 MG tablet Take 2 tablets (650 mg total) by mouth every 4 (four) hours as needed for mild pain (or temp > 37.5 C (99.5 F)). 03/01/17  Yes Debbe Odea, MD  atorvastatin (LIPITOR) 80 MG tablet Take 1 tablet (80 mg total) by mouth daily at 6 PM. 03/01/17  Yes Rizwan, Saima, MD  ELIQUIS 5 MG TABS tablet Take 5 mg by mouth 2 (two) times daily.  09/30/17  Yes [provider]  hydrochlorothiazide (HYDRODIURIL) 25 MG tablet Take 25 mg by mouth daily.   Yes [provider]  lisinopril (PRINIVIL,ZESTRIL) 20 MG tablet Take 20 mg by mouth daily.   Yes [provider]  metoprolol tartrate (LOPRESSOR) 25 MG tablet Take 0.5 tablets (12.5 mg total) by mouth 2 (two) times daily. 10/02/17  Yes Sherran Needs, NP  pioglitazone (ACTOS) 30 MG tablet Take 30 mg by mouth daily.  06/10/16  Yes [provider]  potassium chloride SA (K-DUR,KLOR-CON) 20 MEQ tablet Take 2 tablets (40 mEq total) by mouth daily. 03/01/17  Yes Debbe Odea, MD  rivastigmine (EXELON) 3 MG capsule Take 1 capsule (3 mg total) by mouth 2 (two) times daily. 12/03/17  Yes Cameron Sprang, MD  metFORMIN (GLUCOPHAGE) 500 MG tablet Take 1 tablet (500 mg total) by mouth 2 (two) times daily with a  meal. 03/25/19 04/24/19  Janilah Hojnacki A, PA-C    Family History Family History  Problem Relation Age of Onset  . Hypertension Mother   . Stroke Mother     Social History Social History   Tobacco Use  . Smoking status: Never Smoker  . Smokeless tobacco: Never Used  Substance Use Topics  . Alcohol use: No  . Drug use: No     Allergies   Patient has no known allergies.   Review of Systems Review of Systems  Unable to perform ROS: Dementia     Physical Exam Updated Vital Signs BP (!) 175/74   Pulse 69   Temp 97.7 F (36.5 C) (Oral)   Resp 15   SpO2 100%    Physical Exam Vitals signs and nursing note reviewed. Exam conducted with a chaperone present.  Constitutional:      General: She is not in acute distress.    Appearance: She is well-developed. She is not ill-appearing or toxic-appearing.  HENT:     Head: Normocephalic and atraumatic.     Jaw: There is normal jaw occlusion.     Mouth/Throat:     Lips: Pink.     Mouth: Mucous membranes are dry.     Pharynx: Oropharynx is clear. Uvula midline.     Comments: Mucous membranes dry. Tongue midline. Eyes:     Pupils: Pupils are equal, round, and reactive to light.     Comments: EOM intact. PERRLA  Neck:     Musculoskeletal: Full passive range of motion without pain and normal range of motion.     Vascular: No JVD.     Trachea: Phonation normal.  Cardiovascular:     Rate and Rhythm: Normal rate.     Pulses: Normal pulses.          Dorsalis pedis pulses are 2+ on the right side and 2+ on the left side.       Posterior tibial pulses are 2+ on the right side and 2+ on the left side.     Heart sounds: Normal heart sounds.  Pulmonary:     Effort: Pulmonary effort is normal. No respiratory distress.     Breath sounds: Normal breath sounds and air entry.     Comments: Speaks in full sentences without difficulty. Abdominal:     General: Bowel sounds are normal. There is no distension.     Palpations: Abdomen is soft.     Tenderness: There is no abdominal tenderness.  Musculoskeletal: Normal range of motion.     Right hip: Normal.     Left hip: Normal.     Comments: Moves all 4 extremities without difficulty. Pelvis stable, non tender to palpation. No shortening or rotations of leg. Negative varus, valgus stress to bilateral knees. Negative straight leg raies bilaterally. No tenderness to bilateral femur, fib, tib, feet  Feet:     Right foot:     Toenail Condition: Right toenails are abnormally thick.     Left foot:     Toenail Condition: Left toenails are abnormally thick.     Comments:  Mild chronic venus stasis skin changes bilaterally.  Skin:    General: Skin is warm and dry.     Capillary Refill: Capillary refill takes less than 2 seconds.  Neurological:     General: No focal deficit present.     Cranial Nerves: Cranial nerves are intact.     Sensory: Sensation is intact.     Motor: Motor function is intact.  Coordination: Coordination is intact.     Comments: Pleasantly confused. Follows commands. Negative finger to nose, heel to shin. No facial droop. CN 2-12 intact. Oriented to person, daughter, not time. Shuffling gait.    ED Treatments / Results  Labs (all labs ordered are listed, but only abnormal results are displayed) Labs Reviewed  URINALYSIS, ROUTINE W REFLEX MICROSCOPIC - Abnormal; Notable for the following components:      Result Value   Color, Urine STRAW (*)    Glucose, UA >=500 (*)    Bacteria, UA RARE (*)    All other components within normal limits  BASIC METABOLIC PANEL - Abnormal; Notable for the following components:   Glucose, Bld 459 (*)    BUN 25 (*)    Creatinine, Ser 1.35 (*)    GFR calc non Af Amer 36 (*)    GFR calc Af Amer 42 (*)    All other components within normal limits  CBG MONITORING, ED - Abnormal; Notable for the following components:   Glucose-Capillary 429 (*)    All other components within normal limits  CBG MONITORING, ED - Abnormal; Notable for the following components:   Glucose-Capillary 370 (*)    All other components within normal limits  URINE CULTURE  CBC WITH DIFFERENTIAL/PLATELET    EKG EKG Interpretation  Date/Time:  Thursday March 25 2019 11:39:11 EST Ventricular Rate:  67 PR Interval:    QRS Duration: 99 QT Interval:  435 QTC Calculation: 460 R Axis:   -35 Text Interpretation: Ectopic atrial rhythm Left axis deviation new Nonspecific T abnormalities, inferior leads Confirmed by Blanchie Dessert [id number]) on 03/25/2019 2:18:58 PM   Radiology Dg Chest 2 View  Result Date: 03/25/2019  CLINICAL DATA:  Altered mental status. EXAM: CHEST - 2 VIEW COMPARISON:  02/28/2017 FINDINGS: Lungs are adequately inflated without lobar consolidation or effusion. Cardiomediastinal silhouette and remainder of the exam is unchanged. IMPRESSION: No acute cardiopulmonary disease. Electronically Signed   By: Marin Olp M.D.   On: 03/25/2019 11:58   Ct Head Wo Contrast  Result Date: 03/25/2019 CLINICAL DATA:  Altered level of consciousness (LOC), unexplained. Additional history provided: Per daughter patient is more confused than normal and has been urinating frequently, not wanting to eat. Fall 3 days ago. EXAM: CT HEAD WITHOUT CONTRAST TECHNIQUE: Contiguous axial images were obtained from the base of the skull through the vertex without intravenous contrast. COMPARISON:  MRI/MRA head 02/28/2017, head CT 02/27/2017. FINDINGS: Brain: No evidence of acute intracranial hemorrhage. No demarcated cortical infarction. No evidence of intracranial mass. No midline shift or extra-axial fluid collection. Advanced scattered and confluent T2/FLAIR hyperintensity within the cerebral white matter is nonspecific, but consistent with chronic small vessel ischemic disease. Chronic small vessel ischemic changes also present within the pons to a lesser degree. Moderate generalized parenchymal atrophy. Vascular: No hyperdense vessel.  Atherosclerotic calcifications. Skull: Normal. Negative for fracture or focal lesion. Sinuses/Orbits: Visualized orbits demonstrate no acute abnormality. No significant paranasal sinus disease or mastoid effusion at the imaged levels. IMPRESSION: 1. No evidence of acute intracranial abnormality. 2. Moderate generalized parenchymal atrophy with advanced chronic small vessel ischemic disease. Electronically Signed   By: Kellie Simmering DO   On: 03/25/2019 14:23    Procedures Procedures (including critical care time)  Medications Ordered in ED Medications  sodium chloride 0.9 % bolus 1,000 mL (0  mLs Intravenous Stopped 03/25/19 1355)  metFORMIN (GLUCOPHAGE) tablet 500 mg (500 mg Oral Given 03/25/19 1354)   Initial Impression / Assessment and  Plan / ED Course  I have reviewed the triage vital signs and the nursing notes.  Pertinent labs & imaging results that were available during my care of the patient were reviewed by me and considered in my medical decision making (see chart for details).  ECHO 2018- EF 59-57%  84 year old female presents with daughter for confusion over the last 3 days.  She is afebrile, nonseptic appearing.  Patient with dementia at baseline.  She did have witnessed fall by her daughter 3 days ago however she apparently did not hit her head.  Patient states she has had progressive issues getting fatigued patient to take her medications, specifically her diabetes medicines.  She has not taken these over the last 2 days.  She has had some polyuria and polydipsia.  Patient has a nonfocal neurologic exam without deficits.  She has a normal musculoskeletal exam.  She has been ambulatory with out difficulty in tolerating p.o. intake since her fall.  No prior history of UTIs.  Patient denies any current complaints.  Will obtain labs, urine, CT head, EKG and reevaluate.   Labs and imaging personally reviewed: CBC without leukocytosis BMP with hyperglycemia, elevated BUN, creatinine at 1.35 previous 0.83 Urinalysis with glucose, rare bacteria DG chest without acute infiltrates, cardiomegaly, pulmonary edema, pneumothorax CT head without acute changes EKG No STEMI however with new T wave inversions, Patient denies repeatedly No CP, SOB, Low suspicion for ACS, PE, Dissection at this time.   1430: Patient reevaluated with family in room. Discussed results of imaging and labs. LIkley worsening of dementia with possible dehydration and hyperglycemia. Her CBG is trend down here. She was able to tolerate the smaller Metformin.  Is tolerating p.o. intake in ED without difficulty.   No evidence of DKA.  No evidence of acute CVA on CT scan. Urinalysis negative for UTI however Urine sent for culture. She does have close follow up with PCP on Monday. Daughter feels comfortable taking patient home and following up outpatient.  Her blood pressure has been mildly elevated in the department however appears to have improvement from her prior visits.  I have low suspicion for hypertensive urgency or emergency at this time.  The patient has been appropriately medically screened and/or stabilized in the ED. I have low suspicion for any other emergent medical condition which would require further screening, evaluation or treatment in the ED or require inpatient management.  Patient is hemodynamically stable and in no acute distress.  Patient able to ambulate in department prior to ED.  Evaluation does not show acute pathology that would require ongoing or additional emergent interventions while in the emergency department or further inpatient treatment.  I have discussed the diagnosis with the patient and answered all questions.  Pain is been managed while in the emergency department and patient has no further complaints prior to discharge.  Patient is comfortable with plan discussed in room and is stable for discharge at this time.  I have discussed strict return precautions for returning to the emergency department.  Patient was encouraged to follow-up with PCP/specialist refer to at discharge.        Patient seen and evaluated by attending physician Dr. Maryan Rued who agrees with above treatment, plan and disposition. Final Clinical Impressions(s) / ED Diagnoses   Final diagnoses:  Dementia without behavioral disturbance, unspecified dementia type ([place])  AKI (acute kidney injury) ([place])  Hyperglycemia    ED Discharge Orders         Ordered    metFORMIN (  GLUCOPHAGE) 500 MG tablet  2 times daily with meals     03/25/19 1443           Anona Giovannini A, PA-C 03/25/19 1445     Blanchie Dessert, MD 03/25/19 1508

## 2019-03-25 NOTE — Discharge Instructions (Signed)
Keep appointment with Primary on Monday. Return to the ED for new or worsening symptoms.

## 2019-03-25 NOTE — ED Triage Notes (Signed)
Patient reports to the ED accompanied by her daughter. Patient is reportedly more confused than normal and has been urinating frequently, not wanting to eat, and not wanting to take her metformin.  Patient's daughter states patient fell x3 days ago but did not hit her head and her knee was scraped. Patient reportedly is not complaining of any pain

## 2019-03-26 LAB — URINE CULTURE: Culture: <10000 — AB

## 2019-05-24 ENCOUNTER — Telehealth: Payer: Self-pay | Admitting: Neurology

## 2019-05-24 NOTE — Telephone Encounter (Signed)
Tammy from Dr. Jori Moll Polite's office called with the patient's daughter on the line requesting to schedule an appointment for the patient with Dr. Delice Lesch. She is having increased confusion, behavioral changes, and is not sleeping.  Dr. Lina Sar office will send it over as a referral for this existing patient.   Routing to clinical staff for further assessment prior to scheduling.

## 2019-05-24 NOTE — Telephone Encounter (Signed)
It is harder with short staffing, but usually for patients requesting urgent/earlier visits, would call them back to get more info on why they need earlier visit and then send to me so I can look at schedule where she can be fit in if truly urgent. Thanks

## 2019-05-25 NOTE — Telephone Encounter (Signed)
Spoke with pt daughter she stated that Brandi Griffin is always on the Go, she is following them around the house when you turn around she is right there, she will go in the kitchen and mix things together like drink and sour cream and try to drink it, she is always on the go doesn't stop, they cant get her to sit and rest for even 30 mins to watch a tv show, she will take her shoes off cant remember where she puts them when they find them she says she doesn't want or need her shoes, When Brandi Griffin Daughter has to work they stay with the granddaughter and she has to sit in front of the great grand kids door at nap time so they can sleep because Brandi Griffin will go in and keep them up. They are asking if it is anything she can have PRN to help her rest so they can get some rest.

## 2019-05-25 NOTE — Telephone Encounter (Signed)
I spoke with pt daughter informed her that She has not been seen in over a year, it would be difficult to just start a medication without evaluating her. Ok to put her in for Feb 2 at 8:30am, would have to be virtual at this time, appointment moved from the 4th to the 2nd,

## 2019-05-25 NOTE — Telephone Encounter (Signed)
She has not been seen in over a year, it would be difficult to just start a medication without evaluating her. Ok to put her in for Feb 2 at 8:30am, would have to be virtual at this time. Thanks

## 2019-06-01 ENCOUNTER — Encounter: Payer: Self-pay | Admitting: Neurology

## 2019-06-01 ENCOUNTER — Other Ambulatory Visit: Payer: Self-pay

## 2019-06-01 ENCOUNTER — Telehealth (INDEPENDENT_AMBULATORY_CARE_PROVIDER_SITE_OTHER): Payer: Medicare Other | Admitting: Neurology

## 2019-06-01 DIAGNOSIS — F03B18 Unspecified dementia, moderate, with other behavioral disturbance: Secondary | ICD-10-CM

## 2019-06-01 DIAGNOSIS — F0391 Unspecified dementia with behavioral disturbance: Secondary | ICD-10-CM | POA: Diagnosis not present

## 2019-06-01 MED ORDER — RIVASTIGMINE TARTRATE 3 MG PO CAPS: 3.0000 mg | ORAL_CAPSULE | Freq: Two times a day (BID) | ORAL | 3 refills | Status: DC

## 2019-06-01 MED ORDER — TRAZODONE HCL 50 MG PO TABS: ORAL_TABLET | ORAL | 11 refills | Status: DC

## 2019-06-01 NOTE — Progress Notes (Signed)
Virtual Visit via Video Note The purpose of this virtual visit is to provide medical care while limiting exposure to the novel coronavirus.    Consent was obtained for video visit:  Yes.   Answered questions that patient had about telehealth interaction:  Yes.   I discussed the limitations, risks, security and privacy concerns of performing an evaluation and management service by telemedicine. I also discussed with the patient that there may be a patient responsible charge related to this service. The patient expressed understanding and agreed to proceed.  Pt location: Home Physician Location: office Name of referring provider:  Seward Carol, MD I connected with [name] at patients initiation/request on 06/01/2019 at  8:30 AM EST by video enabled telemedicine application and verified that I am speaking with the correct person using two identifiers. Pt MRN:  [id number] Pt DOB:  1935-03-17 Video Participants:  [name];  Rosalene Billings (daughter), Kathlee Nations (granddaughter)   History of Present Illness:  The patient was seen as a virtual video visit on 06/01/2019. She was last seen in the neurology clinic in August 2019 for dementia and stroke (02/2017). Her daughter and granddaughter are present during the e-visit to provide additional information. Since her last visit, family called about increasing issues with agitation and insomnia for the past 3 weeks. She has hallucinations and paranoia during the day, but starting 11pm, symptoms would become unbearable for family. She would start asking large amount of questions that do not make sense. She sees the recent insurrection on the news and thinks we are in a civil war and need to evacuate. She tells family at [place] that they need to get out and leave. She then starts dozing off at 8am. Family tries to keep her awake during the day but this has been hard. There have been no recent changes in her routine, but "everything going on with  the virus really upsets her." She was evaluated by her PCP with no evidence of ongoing infection. They tried Benadryl which did not help with sleep. Last night she did not know who her granddaughter was or where she was. She got up one night and put ranch sauce in all the drinks. She needs help with dressing and bathing, looking for her clothes or pocketbook (she does not have a pocketbook anymore). She had 2 falls in the last week where she stumbles on her feet, no head injuries. Last night she felt nauseated. She is on Rivastigmine 3mg  BID without side effects.    Current Outpatient Medications on File Prior to Visit  Medication Sig Dispense Refill  . acetaminophen (TYLENOL) 325 MG tablet Take 2 tablets (650 mg total) by mouth every 4 (four) hours as needed for mild pain (or temp > 37.5 C (99.5 F)).    Marland Kitchen atorvastatin (LIPITOR) 80 MG tablet Take 1 tablet (80 mg total) by mouth daily at 6 PM. 60 tablet 0  . ELIQUIS 5 MG TABS tablet Take 5 mg by mouth 2 (two) times daily.     . hydrochlorothiazide (HYDRODIURIL) 25 MG tablet Take 25 mg by mouth daily.    . insulin aspart (NOVOLOG) 100 UNIT/ML injection Inject 10 Units into the skin once.    Marland Kitchen lisinopril (PRINIVIL,ZESTRIL) 20 MG tablet Take 20 mg by mouth daily.    . metFORMIN (GLUCOPHAGE) 500 MG tablet Take 1 tablet (500 mg total) by mouth 2 (two) times daily with a meal. (Patient taking differently: Take 1,000 mg by mouth 2 (two) times  daily with a meal. ) 60 tablet 0  . metoprolol tartrate (LOPRESSOR) 25 MG tablet Take 0.5 tablets (12.5 mg total) by mouth 2 (two) times daily. (Patient taking differently: Take 25 mg by mouth 2 (two) times daily. ) 30 tablet 2  . pioglitazone (ACTOS) 30 MG tablet Take 30 mg by mouth daily.   3  . potassium chloride SA (K-DUR,KLOR-CON) 20 MEQ tablet Take 2 tablets (40 mEq total) by mouth daily. 30 tablet 0   No current facility-administered medications on file prior to visit.     Observations/Objective:   GEN:  The  patient appears stated age and is in NAD.  Neurological examination: Patient is awake, alert, oriented to person, month, state. No aphasia or dysarthria. Reduced fluency, some difficulty with following 2-step commands. Remote and recent memory impaired. MOCA blind (done over phone) score 12/22. Montreal Cognitive Assessment Blind 06/01/2019  Attention: Read list of digits (0/2) 2  Attention: Read list of letters (0/1) 1  Attention: Serial 7 subtraction starting at 100 (0/3) 3  Language: Repeat phrase (0/2) 1  Language : Fluency (0/1) 1  Abstraction (0/2) 2  Delayed Recall (0/5) 0  Orientation (0/6) 2  Total 12   Cranial nerves: Extraocular movements intact with no nystagmus. No facial asymmetry. Motor: moves all extremities symmetrically, at least anti-gravity x 4. No incoordination on finger to nose testing. Gait: slightly wide-based, no ataxia  Assessment and Plan:   This is a pleasant 85 yo RH woman with vascular risk factors including hypertension, hyperlipidemia, diabetes,microembolic strokes in multiple vascular territories last November 2018, atrial fibrillation on Eliquis, with worsening behavioral and sleep changes due to underlying dementia. We discussed starting Trazodone 50mg  qhs, may increase dose to 100mg  qhs in a week if no side effects. Side effects discussed. We may add on Depakote in the future for mood stabilization. Continue Rivastigmine 3mg  BID. Continue 24/7 care. We discussed response strategies for behaviors, resources will be provided to family. Follow-up in 6 months, family knows to call for any changes.   Follow Up Instructions:   -I discussed the assessment and treatment plan with the patient/family. The patient/family were provided an opportunity to ask questions and all were answered. The patient/family agreed with the plan and demonstrated an understanding of the instructions.   The patient/family were advised to call back or seek an in-person evaluation if the  symptoms worsen or if the condition fails to improve as anticipated.    Cameron Sprang, MD

## 2019-06-03 ENCOUNTER — Telehealth: Payer: Medicare Other | Admitting: Neurology

## 2019-06-18 ENCOUNTER — Telehealth: Payer: Self-pay | Admitting: Neurology

## 2019-06-18 NOTE — Telephone Encounter (Signed)
Try increasing Trazodone to 150mg  qhs, but also pls have them try as best they can to keep her up during the day so she is tired and hopefully falls asleep at night. Thanks

## 2019-06-18 NOTE — Telephone Encounter (Signed)
Patient's daughter called and said the medication recently prescribed 37 Mg Trazadone is calming the patient down but she is still not sleeping through the night, even since increasing to 100 MG at night. She has been watching TV all night then sleeping in the daytime.  [facility] on [address]

## 2019-06-18 NOTE — Telephone Encounter (Signed)
Spoke with pt daughter she will try to increase the trazadone to 150mg  and keep her up,  daughter also asking is there anything that can be done to help stop her mom from masturbating all of the time, she says doesn't matter what room of the house she will do it and it not just once a day

## 2019-06-28 ENCOUNTER — Ambulatory Visit: Payer: Medicare Other | Attending: Internal Medicine

## 2019-06-28 DIAGNOSIS — Z23 Encounter for immunization: Secondary | ICD-10-CM | POA: Insufficient documentation

## 2019-06-28 NOTE — Progress Notes (Signed)
   Covid-19 Vaccination Clinic  Name:  Brandi Griffin    MRN: [id number] DOB: [birth year]  06/28/2019  Brandi Griffin was observed post Covid-19 immunization for 15 minutes without incidence. She was provided with Vaccine Information Sheet and instruction to access the V-Safe system.   Brandi Griffin was instructed to call 911 with any severe reactions post vaccine: Marland Kitchen Difficulty breathing  . Swelling of your face and throat  . A fast heartbeat  . A bad rash all over your body  . Dizziness and weakness    Immunizations Administered    Name Date Dose VIS Date Route   Pfizer COVID-19 Vaccine 06/28/2019 12:37 PM 0.3 mL 04/09/2019 Intramuscular   Manufacturer: [place]   Lot: [id number]   [place]: [id number]

## 2019-07-01 ENCOUNTER — Telehealth: Payer: Self-pay | Admitting: Neurology

## 2019-07-01 NOTE — Telephone Encounter (Signed)
recent office visit notes faxed to . Dr. Seward Carol

## 2019-07-01 NOTE — Telephone Encounter (Signed)
[facility] is requesting visit notes from this patient's recent visits. Dr. Seward Carol is was the referring physician and still needs notes.   fax: [phone]

## 2019-07-07 ENCOUNTER — Telehealth: Payer: Self-pay | Admitting: Neurology

## 2019-07-07 NOTE — Telephone Encounter (Signed)
Patient's daughter is calling in about wanting to speak with the nurse regarding patient's condition. She is getting worse and was needing some advice. She wouldn't give me any more info than that. Thanks!

## 2019-07-07 NOTE — Telephone Encounter (Signed)
Called pt daughter back no answer voice mail full

## 2019-07-08 ENCOUNTER — Other Ambulatory Visit: Payer: Self-pay

## 2019-07-08 MED ORDER — PAROXETINE HCL 20 MG PO TABS: 20.0000 mg | ORAL_TABLET | Freq: Every day | ORAL | 1 refills | Status: DC

## 2019-07-08 NOTE — Telephone Encounter (Signed)
Pt daughter called and informed that this is usually harder to treat, but we can try Paroxetine 20mg  daily, this may take 1-3 weeks to take effect. Side effects can include drowsiness, dizziness. Pt daughter [name] verbalized understanding and they would like to try the medication, medication sent to the pharmacy

## 2019-07-08 NOTE — Telephone Encounter (Signed)
Pls let her know that this is usually harder to treat, but we can try Paroxetine 20mg  daily, this may take 1-3 weeks to take effect. Side effects can include drowsiness, dizziness. Thanks

## 2019-07-27 ENCOUNTER — Ambulatory Visit: Payer: Medicare Other | Attending: Internal Medicine

## 2019-07-27 DIAGNOSIS — Z23 Encounter for immunization: Secondary | ICD-10-CM

## 2019-07-27 NOTE — Progress Notes (Signed)
   Covid-19 Vaccination Clinic  Name:  Brandi Griffin    MRN: [id number] DOB: 1935/01/25  07/27/2019  [name] was observed post Covid-19 immunization for 15 minutes without incident. She was provided with Vaccine Information Sheet and instruction to access the V-Safe system.   [name] was instructed to call 911 with any severe reactions post vaccine: Marland Kitchen Difficulty breathing  . Swelling of face and throat  . A fast heartbeat  . A bad rash all over body  . Dizziness and weakness   Immunizations Administered    Name Date Dose VIS Date Route   Pfizer COVID-19 Vaccine 07/27/2019 12:25 PM 0.3 mL 04/09/2019 Intramuscular   Manufacturer: [place]   Lot: [id number]   [place]: [id number]

## 2019-11-16 ENCOUNTER — Encounter: Payer: Self-pay | Admitting: Neurology

## 2019-11-25 ENCOUNTER — Encounter: Payer: Self-pay | Admitting: Neurology

## 2019-11-25 ENCOUNTER — Ambulatory Visit (INDEPENDENT_AMBULATORY_CARE_PROVIDER_SITE_OTHER): Payer: Medicare Other | Admitting: Neurology

## 2019-11-25 ENCOUNTER — Other Ambulatory Visit: Payer: Self-pay

## 2019-11-25 VITALS — BP 153/72 | HR 90 | Wt 128.0 lb

## 2019-11-25 DIAGNOSIS — F0391 Unspecified dementia with behavioral disturbance: Secondary | ICD-10-CM | POA: Diagnosis not present

## 2019-11-25 DIAGNOSIS — F03B18 Unspecified dementia, moderate, with other behavioral disturbance: Secondary | ICD-10-CM

## 2019-11-25 MED ORDER — QUETIAPINE FUMARATE 25 MG PO TABS
ORAL_TABLET | ORAL | 11 refills | Status: DC
Start: 2019-11-25 — End: 2019-11-29

## 2019-11-25 MED ORDER — PAROXETINE HCL 20 MG PO TABS: 20.0000 mg | ORAL_TABLET | Freq: Every day | ORAL | 3 refills | Status: AC

## 2019-11-25 MED ORDER — RIVASTIGMINE TARTRATE 3 MG PO CAPS: 3.0000 mg | ORAL_CAPSULE | Freq: Two times a day (BID) | ORAL | 3 refills | Status: DC

## 2019-11-25 NOTE — Progress Notes (Signed)
NEUROLOGY FOLLOW UP OFFICE NOTE  Brandi Griffin [id number] December 25, 1934  HISTORY OF PRESENT ILLNESS: I had the pleasure of seeing Brandi Griffin in follow-up in the neurology clinic on 11/25/2019.  The patient was last seen 5 months ago in the neurology clinic for moderate dementia with behavioral disturbance. She is again accompanied by her daughter who helps supplement the history today. Since her last visit, her daughter has called to report more behavioral changes. She called in February to report minimal response to Trazodone, dose increased to 150mg  qhs. Daughter also reported patient masturbating multiple times a day in front of family. 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 Her daughter has not noticed any changes in symptoms and is visibly frustrated today. Ms Polyak is calm and when daughter reports she is not sleeping well, she is surprised and asks "no? What is wrong with it?" She also calmly asks her daughter "what do I do that is so uncomfortable for you?" and daughter relates that she walks around naked and masturbates. The Trazodone has not helped, her daughter feels that 3 tabs (150mg ) makes things worse, she is up and down all night, usually seeing things, looking for people or animals under the bed. She denies any headaches, dizziness, vision changes, focal numbness/tingling/weakness, no falls. Sometimes she refuses her medications or does not eat well. She is on Rivastigmine 3mg  BID without side effects.   PAST MEDICAL HISTORY: Past Medical History:  Diagnosis Date  . Cancer ([place])   . Diabetes mellitus without complication ([place])   . Hypertension   . Paroxysmal atrial fibrillation (HCC)   . Stroke (cerebrum) [facility])     MEDICATIONS: Current Outpatient Medications on File Prior to Visit  Medication Sig Dispense Refill  . acetaminophen (TYLENOL) 325 MG tablet Take 2 tablets (650 mg total) by mouth every 4 (four)  hours as needed for mild pain (or temp > 37.5 C (99.5 F)).    Marland Kitchen atorvastatin (LIPITOR) 80 MG tablet Take 1 tablet (80 mg total) by mouth daily at 6 PM. 60 tablet 0  . ELIQUIS 5 MG TABS tablet Take 5 mg by mouth 2 (two) times daily.     . hydrochlorothiazide (HYDRODIURIL) 25 MG tablet Take 25 mg by mouth daily.    . insulin aspart (NOVOLOG) 100 UNIT/ML injection Inject 10 Units into the skin once.    Marland Kitchen lisinopril (PRINIVIL,ZESTRIL) 20 MG tablet Take 20 mg by mouth daily.    . metFORMIN (GLUCOPHAGE) 500 MG tablet Take 1 tablet (500 mg total) by mouth 2 (two) times daily with a meal. (Patient taking differently: Take 1,000 mg by mouth 2 (two) times daily with a meal. ) 60 tablet 0  . metoprolol tartrate (LOPRESSOR) 25 MG tablet Take 0.5 tablets (12.5 mg total) by mouth 2 (two) times daily. (Patient taking differently: Take 25 mg by mouth 2 (two) times daily. ) 30 tablet 2  . PARoxetine (PAXIL) 20 MG tablet Take 1 tablet (20 mg total) by mouth daily. 30 tablet 1  . pioglitazone (ACTOS) 30 MG tablet Take 30 mg by mouth daily.   3  . potassium chloride SA (K-DUR,KLOR-CON) 20 MEQ tablet Take 2 tablets (40 mEq total) by mouth daily. 30 tablet 0  . rivastigmine (EXELON) 3 MG capsule Take 1 capsule (3 mg total) by mouth 2 (two) times daily. 180 capsule 3  . traZODone (DESYREL) 50 MG tablet Take 1 tablet every night for 1 week,  then increase to 2 tablets every night 60 tablet 11   No current facility-administered medications on file prior to visit.    ALLERGIES: No Known Allergies  FAMILY HISTORY: Family History  Problem Relation Age of Onset  . Hypertension Mother   . Stroke Mother     SOCIAL HISTORY: Social History   Socioeconomic History  . Marital status: Divorced    Spouse name: Not on file  . Number of children: 1  . Years of education: college  . Highest education level: Not on file  Occupational History  . Occupation: retired  Tobacco Use  . Smoking status: Never Smoker  .  Smokeless tobacco: Never Used  Vaping Use  . Vaping Use: Never used  Substance and Sexual Activity  . Alcohol use: No  . Drug use: No  . Sexual activity: Not Currently  Other Topics Concern  . Not on file  Social History Narrative   Lives with daughter in a 2 story home.  Has one daughter.  Retired Animal nutritionist.  Education: college.   Social Determinants of Health   Financial Resource Strain:   . Difficulty of Paying Living Expenses:   Food Insecurity:   . Worried About Charity fundraiser in the Last Year:   . Arboriculturist in the Last Year:   Transportation Needs:   . Film/video editor (Medical):   Marland Kitchen Lack of Transportation (Non-Medical):   Physical Activity:   . Days of Exercise per Week:   . Minutes of Exercise per Session:   Stress:   . Feeling of Stress :   Social Connections:   . Frequency of Communication with Friends and Family:   . Frequency of Social Gatherings with Friends and Family:   . Attends Religious Services:   . Active Member of Clubs or Organizations:   . Attends Archivist Meetings:   Marland Kitchen Marital Status:   Intimate Partner Violence:   . Fear of Current or Ex-Partner:   . Emotionally Abused:   Marland Kitchen Physically Abused:   . Sexually Abused:     PHYSICAL EXAM: Vitals:   11/25/19 1357  BP: (!) 153/72  Pulse: 90  SpO2: 99%   General: No acute distress Head:  Normocephalic/atraumatic Skin/Extremities: No rash, no edema Neurological Exam: alert and oriented to person, place. No aphasia or dysarthria. Fund of knowledge is reduced.  Recent and remote memory are impaired.  Attention and concentration are normal.  Able to name objects and repeat phrases. MMSE 22/30 MMSE - Mini Mental State Exam 11/25/2019 06/04/2017 09/11/2016  Orientation to time 0 4 4  Orientation to Place 5 5 5   Registration 3 3 3   Attention/ Calculation 5 5 5   Recall 0 0 0  Language- name 2 objects 2 2 2   Language- repeat 1 1 1   Language- follow 3 step command 3 3 3    Language- read & follow direction 1 1 1   Write a sentence 1 1 1   Copy design 1 1 1   Total score 22 26 26     Cranial nerves: Pupils equal, round, reactive to light. Extraocular movements intact with no nystagmus. Visual fields full. No facial asymmetry. Motor: Bulk and tone normal, muscle strength 5/5 throughout with no pronator drift.  Finger to nose testing intact.  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 She has not had significant response to Trazodone, daughter will reduce to 1 tab qhs x 1 week, then stop. She is having continued sleep difficulties, hallucinations, and hypersexuality. We discussed starting low dose Seroquel 25mg  1/2 tab qhs x 3 days, then increase to 1 tab qhs. Side effects, including cardiac black box warning were discussed. Family has tried several non-pharmacologic measures with minimal response and agrees to start Seroquel. Her last QTc in 02/2019 was 460, continue to monitor. Continue Paxil 20mg  daily and Rivastigmine 3mg  BID. Continue 24/7 care. Follow-up in 6 months, they know to call for any changes.   Thank you for allowing me to participate in her care.  Please do not hesitate to call for any questions or concerns.   Ellouise Newer, M.D.   CC: Dr. Delfina Redwood

## 2019-11-25 NOTE — Patient Instructions (Signed)
1. Reduce Trazodone to 1 tablet every night for a week, then stop  2. Start Seroquel 25mg : Take 1/2 tablet every night for 3 days, then increase to 1 tablet every night  3. Continue Paxil and Rivastigmine  4. Continue 24/7 care  5. Follow-up in 6 months, call for any changes   FALL PRECAUTIONS: Be cautious when walking. Scan the area for obstacles that may increase the risk of trips and falls. When getting up in the mornings, sit up at the edge of the bed for a few minutes before getting out of bed. Consider elevating the bed at the head end to avoid drop of blood pressure when getting up. Walk always in a well-lit room (use night lights in the walls). Avoid area rugs or power cords from appliances in the middle of the walkways. Use a walker or a cane if necessary and consider physical therapy for balance exercise. Get your eyesight checked regularly.   HOME SAFETY: Consider the safety of the kitchen when operating appliances like stoves, microwave oven, and blender. Consider having supervision and share cooking responsibilities until no longer able to participate in those. Accidents with firearms and other hazards in the house should be identified and addressed as well.   ABILITY TO BE LEFT ALONE: If patient is unable to contact 911 operator, consider using LifeLine, or when the need is there, arrange for someone to stay with patients. Smoking is a fire hazard, consider supervision or cessation. Risk of wandering should be assessed by caregiver and if detected at any point, supervision and safe proof recommendations should be instituted.   RECOMMENDATIONS FOR ALL PATIENTS WITH MEMORY PROBLEMS: 1. Continue to exercise (Recommend 30 minutes of walking everyday, or 3 hours every week) 2. Increase social interactions - continue going to [place] and enjoy social gatherings with friends and family 3. Eat healthy, avoid fried foods and eat more fruits and vegetables 4. Maintain adequate blood  pressure, blood sugar, and blood cholesterol level. Reducing the risk of stroke and cardiovascular disease also helps promoting better memory. 5. Avoid stressful situations. Live a simple life and avoid aggravations. Organize your time and prepare for the next day in anticipation. 6. Sleep well, avoid any interruptions of sleep and avoid any distractions in the bedroom that may interfere with adequate sleep quality 7. Avoid sugar, avoid sweets as there is a strong link between excessive sugar intake, diabetes, and cognitive impairment The Mediterranean diet has been shown to help patients reduce the risk of progressive memory disorders and reduces cardiovascular risk. This includes eating fish, eat fruits and green leafy vegetables, nuts like almonds and hazelnuts, walnuts, and also use olive oil. Avoid fast foods and fried foods as much as possible. Avoid sweets and sugar as sugar use has been linked to worsening of memory function.  There is always a concern of gradual progression of memory problems. If this is the case, then we may need to adjust level of care according to patient needs. Support, both to the patient and caregiver, should then be put into place.

## 2019-11-29 ENCOUNTER — Telehealth: Payer: Self-pay | Admitting: Neurology

## 2019-11-29 ENCOUNTER — Other Ambulatory Visit: Payer: Self-pay

## 2019-11-29 MED ORDER — QUETIAPINE FUMARATE 25 MG PO TABS: ORAL_TABLET | ORAL | 11 refills | Status: DC

## 2019-11-29 NOTE — Telephone Encounter (Signed)
Spoke to pt daughter increased seroquel 2 tabs at night per Dr Delice Lesch pt daughter will call back if they need [place],

## 2019-11-29 NOTE — Telephone Encounter (Signed)
Patient's daughter [name] called in wanting some advice. She stated her mother has been taking Seroquel for the last 3 days. The patient has only slept once since starting the medication. She slept for a short time yesterday afternoon. She has been agitated and bringing up people who are no longer here.

## 2019-11-29 NOTE — Telephone Encounter (Signed)
Would daughter like to try increasing the dose to 1 tab every night and see how she does the next 3 days? We can give it twice a day if needed. Thanks

## 2019-12-10 ENCOUNTER — Ambulatory Visit: Payer: Medicare Other | Admitting: Neurology

## 2019-12-21 ENCOUNTER — Other Ambulatory Visit: Payer: Self-pay | Admitting: Neurology

## 2019-12-21 MED ORDER — QUETIAPINE FUMARATE 25 MG PO TABS
ORAL_TABLET | ORAL | 11 refills | Status: DC
Start: 2019-12-21 — End: 2020-07-17

## 2019-12-21 NOTE — Telephone Encounter (Signed)
Rx sent 

## 2019-12-21 NOTE — Telephone Encounter (Signed)
Patient daughter states that we increased the dosage of seroquel to 2 pills a day and she need a new RX sent to the [facility]

## 2020-02-25 ENCOUNTER — Ambulatory Visit: Payer: Medicare Other | Attending: Internal Medicine

## 2020-02-25 DIAGNOSIS — Z23 Encounter for immunization: Secondary | ICD-10-CM

## 2020-02-25 NOTE — Progress Notes (Signed)
   Covid-19 Vaccination Clinic  Name:  Brandi Griffin    MRN: [id number] DOB: Feb 22, 1935  02/25/2020  Brandi Griffin was observed post Covid-19 immunization for 15 minutes without incident. She was provided with Vaccine Information Sheet and instruction to access the V-Safe system.   Brandi Griffin was instructed to call 911 with any severe reactions post vaccine: Marland Kitchen Difficulty breathing  . Swelling of face and throat  . A fast heartbeat  . A bad rash all over body  . Dizziness and weakness

## 2020-07-17 ENCOUNTER — Encounter: Payer: Self-pay | Admitting: Neurology

## 2020-07-17 ENCOUNTER — Ambulatory Visit (INDEPENDENT_AMBULATORY_CARE_PROVIDER_SITE_OTHER): Payer: Medicare Other | Admitting: Neurology

## 2020-07-17 ENCOUNTER — Other Ambulatory Visit: Payer: Self-pay

## 2020-07-17 VITALS — BP 182/81 | HR 66 | Ht 62.0 in | Wt 126.4 lb

## 2020-07-17 DIAGNOSIS — R9431 Abnormal electrocardiogram [ECG] [EKG]: Secondary | ICD-10-CM | POA: Diagnosis not present

## 2020-07-17 DIAGNOSIS — F0391 Unspecified dementia with behavioral disturbance: Secondary | ICD-10-CM

## 2020-07-17 DIAGNOSIS — F03B18 Unspecified dementia, moderate, with other behavioral disturbance: Secondary | ICD-10-CM

## 2020-07-17 MED ORDER — QUETIAPINE FUMARATE 25 MG PO TABS: ORAL_TABLET | ORAL | 11 refills | Status: AC

## 2020-07-17 MED ORDER — DIVALPROEX SODIUM ER 250 MG PO TB24: ORAL_TABLET | ORAL | 11 refills | Status: AC

## 2020-07-17 NOTE — Progress Notes (Signed)
NEUROLOGY FOLLOW UP OFFICE NOTE  Brandi Griffin [id number] 1934-12-15  HISTORY OF PRESENT ILLNESS: I had the pleasure of seeing Brandi Griffin in follow-up in the neurology clinic on 07/17/2020.  The patient was last seen 8 months ago for dementia with behavioral disturbance. She is again accompanied by her daughter Brandi Griffin who helps supplement the history today. On her last visit, family was reporting more behavioral and sleep changes, hypersexuality. She is on Paxil 20mg  daily. Trazodone did not help. Seroquel was started, she is on 50mg  qhs without side effects. She seemed more confused with the Rivastigmine, family stopped it. Brandi Griffin reports that the hypersexuality has quieted down. She is still up all night (patient says "I am?"). She sleeps at 4am and wakes up at 1-2pm. She does not take naps. She is extra-disoriented now, with sundowning, more questions/confused in the evening. They would have long conversations about going to work and who will pick her up in the morning. No hallucinations. She says "I forget easily, I don't remember any of that." She is surprised to hear that she needs help with dressing and bathing. She would not use soap with her wash cloth. Brandi Griffin reports she found out the other day that she does not know how to use a knife anymore. She used to use the Keurig coffee maker, she cannot do this any longer. She could not pour herself anything, one time she was pouring things together like ketchup, so they need to watch her. Family has started sleeping with her due to wandering behaviors, they have put bells on the door. They are moving to [place] in May.   History on Initial Assessment 09/11/2016: This is a very pleasant 86 year old right-handed woman with a history of hypertension, diabetes, presenting for evaluation of worsening memory. She feels her memory is wonderful some days, but other days she shows the thumbs down sign. She has been living with her daughter for  the past 20 years and would be told she repeats herself. Her daughter started noticing changes gradually over the past 6-8 months. She repeats herself more frequently now. They would pass by a place and she reminisces about it, then says the same thing on the way back. She would ask her daughter where she wanted to go, then get in the car and ask where she was going. Her granddaughter reports the same issues. She would forget she why she called her earlier. She fixes her pillbox but forgets her medications, even with an alarm. Her daughter set her alarm clock, she would not know why it was ringing. She denies getting lost driving, but family has restricted her from driving after an incident at [facility] 3 weeks ago. She went in to get diapers and got the wrong one. She went back in to exchange it, then apparently thought someone had taken them and caused an issue at the store. They went back to look at the video and saw her putting the diapers back on the shelf. She does not recall this incident. She looks for her keys and wallet all the time. She puts dishes where they don't usually go. Her daughter will be taking over the bills, she would either forget or overpay for the past 4-5 months. She can bathe and dress herself, but would wear the same things or forget to bathe. No significant personality changes, no hallucinations.  She has noticed a decrease in her sense of smell. Otherwise she denies any headaches, dizziness, diplopia, dysarthria/dysphagia, neck/back pain,  focal numbness/tingling/weakness, bowel/bladder dysfunction, or tremors. She has occasional falls, her daughter states she does not lift her feet up, last fall was 3 months ago or so. They deny any family history of dementia but she was adopted, no history of significant head injury or alcohol use.   Laboratory Data: TSH and B12 normal per family.  PAST MEDICAL HISTORY: Past Medical History:  Diagnosis Date  . Cancer ([place])   . Diabetes  mellitus without complication ([place])   . Hypertension   . Paroxysmal atrial fibrillation (HCC)   . Stroke (cerebrum) [facility])     MEDICATIONS: Current Outpatient Medications on File Prior to Visit  Medication Sig Dispense Refill  . acetaminophen (TYLENOL) 325 MG tablet Take 2 tablets (650 mg total) by mouth every 4 (four) hours as needed for mild pain (or temp > 37.5 C (99.5 F)).    Marland Kitchen atorvastatin (LIPITOR) 80 MG tablet Take 1 tablet (80 mg total) by mouth daily at 6 PM. 60 tablet 0  . ELIQUIS 5 MG TABS tablet Take 5 mg by mouth 2 (two) times daily.     . hydrochlorothiazide (HYDRODIURIL) 25 MG tablet Take 25 mg by mouth daily.    . insulin aspart (NOVOLOG) 100 UNIT/ML injection Inject 10 Units into the skin once.    Marland Kitchen lisinopril (PRINIVIL,ZESTRIL) 20 MG tablet Take 20 mg by mouth daily.    . metFORMIN (GLUCOPHAGE) 500 MG tablet Take 1 tablet (500 mg total) by mouth 2 (two) times daily with a meal. (Patient taking differently: Take 1,000 mg by mouth 2 (two) times daily with a meal.) 60 tablet 0  . metoprolol tartrate (LOPRESSOR) 25 MG tablet Take 0.5 tablets (12.5 mg total) by mouth 2 (two) times daily. (Patient taking differently: Take 25 mg by mouth 2 (two) times daily.) 30 tablet 2  . PARoxetine (PAXIL) 20 MG tablet Take 1 tablet (20 mg total) by mouth daily. 90 tablet 3  . potassium chloride SA (K-DUR,KLOR-CON) 20 MEQ tablet Take 2 tablets (40 mEq total) by mouth daily. (Patient taking differently: Take 40 mEq by mouth 2 (two) times daily.) 30 tablet 0  . QUEtiapine (SEROQUEL) 25 MG tablet Take 2 tablets every night 60 tablet 11  . rivastigmine (EXELON) 3 MG capsule Take 1 capsule (3 mg total) by mouth 2 (two) times daily. 180 capsule 3  . pioglitazone (ACTOS) 30 MG tablet Take 30 mg by mouth daily.  (Patient not taking: Reported on 07/17/2020)  3   No current facility-administered medications on file prior to visit.    ALLERGIES: No Known Allergies  FAMILY HISTORY: Family History   Problem Relation Age of Onset  . Hypertension Mother   . Stroke Mother     SOCIAL HISTORY: Social History   Socioeconomic History  . Marital status: Divorced    Spouse name: Not on file  . Number of children: 1  . Years of education: college  . Highest education level: Not on file  Occupational History  . Occupation: retired  Tobacco Use  . Smoking status: Never Smoker  . Smokeless tobacco: Never Used  Vaping Use  . Vaping Use: Never used  Substance and Sexual Activity  . Alcohol use: No  . Drug use: No  . Sexual activity: Not Currently  Other Topics Concern  . Not on file  Social History Narrative   Lives with daughter in a 2 story home.  Has one daughter.  Retired Animal nutritionist.  Education: college.   Drinks Caffeine  Right Handed   Social Determinants of Health   Financial Resource Strain: Not on file  Food Insecurity: Not on file  Transportation Needs: Not on file  Physical Activity: Not on file  Stress: Not on file  Social Connections: Not on file  Intimate Partner Violence: Not on file     PHYSICAL EXAM: Vitals:   07/17/20 1525  BP: (!) 182/81  Pulse: 66  SpO2: 97%   General: No acute distress Head:  Normocephalic/atraumatic Skin/Extremities: No rash, no edema Neurological Exam: alert and oriented to person, place. No aphasia or dysarthria. Fund of knowledge is reduced.  Recent and remote memory are impaired.  Attention and concentration are normal.  MMSE 20/30 MMSE - Mini Mental State Exam 07/17/2020 11/25/2019 06/04/2017  Orientation to time 0 0 4  Orientation to Place 4 5 5   Registration 3 3 3   Attention/ Calculation 5 5 5   Recall 0 0 0  Language- name 2 objects 2 2 2   Language- repeat 1 1 1   Language- follow 3 step command 3 3 3   Language- read & follow direction 1 1 1   Write a sentence 0 1 1  Copy design 1 1 1   Total score 20 22 26    Cranial nerves: Pupils equal, round. Extraocular movements intact with no nystagmus. Visual fields  full.  No facial asymmetry.  Motor: Bulk and tone normal, muscle strength 5/5 throughout with no pronator drift.   Finger to nose testing intact.  Gait slow and cautious, no ataxia. No tremors.   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 Behavioral changes (hypersexuality) has improved with Seroquel, however she continues to have sleep difficulties and other behaviors. We will do a trial of Depakote ER 250mg  qhs, side effects discussed. Continue Seroquel 50mg  qhs. Schedule EKG to monitor QTc. They will be moving to [place] and will establish care locally for follow-up, call for any questions/concerns.    Thank you for allowing me to participate in her care.  Please do not hesitate to call for any questions or concerns.   Ellouise Newer, M.D.   CC: Dr. Delfina Redwood

## 2020-07-17 NOTE — Patient Instructions (Signed)
1. Start Depakote ER 250mg : Take 1 tablet every night  2. Continue Seroquel 25mg : take 2 tablet every night  3. Schedule EKG  4. Follow-up with Neurology in [place], call for any issues

## 2020-07-19 ENCOUNTER — Other Ambulatory Visit: Payer: Self-pay | Admitting: Neurology

## 2020-12-27 ENCOUNTER — Other Ambulatory Visit: Payer: Self-pay | Admitting: Neurology

## 2021-07-18 ENCOUNTER — Encounter
Admit: 2021-07-18 | Payer: PRIVATE HEALTH INSURANCE | Attending: Student in an Organized Health Care Education/Training Program

## 2021-07-30 ENCOUNTER — Encounter
Admit: 2021-07-30 | Payer: PRIVATE HEALTH INSURANCE | Attending: Student in an Organized Health Care Education/Training Program

## 2021-07-30 ENCOUNTER — Ambulatory Visit: Admit: 2021-07-30 | Payer: BLUE CROSS/BLUE SHIELD | Attending: Anesthesiology

## 2021-07-30 DIAGNOSIS — E7800 Pure hypercholesterolemia, unspecified: Secondary | ICD-10-CM

## 2021-07-30 DIAGNOSIS — I10 Essential (primary) hypertension: Secondary | ICD-10-CM

## 2021-07-30 DIAGNOSIS — D151 Benign neoplasm of heart: Secondary | ICD-10-CM

## 2021-07-30 DIAGNOSIS — I639 Cerebral infarction, unspecified: Secondary | ICD-10-CM

## 2021-07-30 DIAGNOSIS — E119 Type 2 diabetes mellitus without complications: Secondary | ICD-10-CM

## 2021-07-30 DIAGNOSIS — F0390 Unspecified dementia without behavioral disturbance: Secondary | ICD-10-CM

## 2021-07-30 DIAGNOSIS — C801 Malignant (primary) neoplasm, unspecified: Secondary | ICD-10-CM

## 2021-07-30 DIAGNOSIS — Z96631 Presence of right artificial wrist joint: Secondary | ICD-10-CM

## 2021-07-30 DIAGNOSIS — W19XXXA Unspecified fall, initial encounter: Secondary | ICD-10-CM

## 2021-07-30 MED ORDER — LINAGLIPTIN 5 MG TABLET
5 mg | Freq: Every morning | ORAL | Status: AC
Start: 2021-07-30 — End: ?

## 2021-07-30 MED ORDER — QUETIAPINE IMMEDIATE RELEASE 25 MG TABLET
25 mg | Freq: Every evening | ORAL | Status: AC
Start: 2021-07-30 — End: ?

## 2021-07-30 MED ORDER — LISINOPRIL 20 MG TABLET
20 mg | Freq: Every morning | ORAL | Status: AC
Start: 2021-07-30 — End: ?

## 2021-07-30 MED ORDER — MELOXICAM 15 MG TABLET
15 mg | Freq: Every day | ORAL | Status: AC | PRN
Start: 2021-07-30 — End: 2021-08-01

## 2021-07-30 MED ORDER — HYDROCHLOROTHIAZIDE 25 MG TABLET
25 mg | Freq: Every evening | ORAL | Status: AC
Start: 2021-07-30 — End: ?

## 2021-07-30 MED ORDER — DIVALPROEX ER 250 MG TABLET,EXTENDED RELEASE 24 HR
250 mg | Freq: Every evening | ORAL | Status: AC
Start: 2021-07-30 — End: ?

## 2021-07-30 MED ORDER — ATORVASTATIN 80 MG TABLET
80 mg | Freq: Every evening | ORAL | Status: AC
Start: 2021-07-30 — End: ?

## 2021-07-30 MED ORDER — PAROXETINE 20 MG TABLET
20 mg | Freq: Every evening | ORAL | Status: AC
Start: 2021-07-30 — End: ?

## 2021-07-30 MED ORDER — APIXABAN 5 MG TABLET
5 mg | Freq: Two times a day (BID) | ORAL | Status: SS
Start: 2021-07-30 — End: 2021-08-01

## 2021-07-30 NOTE — Other
Review	RIGHT WRIST REMOVAL OF IMPLANT DEEP (EG, BURIED WIRE. PIN, SCREW,METAL BAND,NAIR, ROD OR PLATE) - Right	Danielle Campos	[facility]	Scheduled		Notes for Anesthesia: unable to contact [place] NP: Jacqulyn Liner

## 2021-07-31 NOTE — H&P
Chief ComplaintFollow-up, right wrist hardware loosening History Of Present Illness28 year old female with history of type 2 diabetes, hypertension, and dementia, presenting with her daughter today who provides much of the history.  I last saw him in January, at which point she was having significant right wrist pain with range of motion and x-rays were revealing for significant backout of one of the radial styloid screws.  At that time, I recommended a removal of hardware as I did feel this loose screw in particular was likely causing her significant tendinitis.  Unfortunately, on meeting with her primary care physician for clearances, there was some concern about changes on her EKG and she then needs to undergo stress testing and echocardiogram which delayed her ability to proceed with surgery.  She has since completed all studies and received clearance from her cardiologist, with the caveat that she can only discontinue Eliquis for about 2 days preoperatively.  They return today eager for screw removal.  Her daughter reports that she does complain of pain on a daily basis.  Although I gave them a wrist brace at the last visit and encouraged her to wear it is much as possible to prevent further tendon irritation, her daughter reports that she is not especially compliant with this and removes it almost immediately.Physical ExaminationNAD, RRR, CTABRight wrist:Skin intact.  Well-healed longitudinal incision over the volar aspect of the right wristPalpable screw about 2 cm proximal to the wrist crease, focally tender to palpation in this areaWrist range of motion about 20/20 limited by stiffness.  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  She has now obtained PCP and cardiology clearance, and they would like hardware removal as soon as possible.  Unfortunately, on contacting the outside surgery center that performed the index procedure, the previous surgeon has since retired and his operative reports are no longer available in the EMR.  After polling some of my colleagues, I am unable to determine which distal radius plate is seen in these films, and I counseled the daughter that I may not have the appropriate instrumentation to remove the entire plate.  I do believe that the loose screw is the source of most of her issues, and I should certainly be able to remove this screw.  My preference would be to remove the remaining hardware, particularly if there is any evidence of loosening, but I will have universal screwdriver set available and if for some reason I am unable to remove the remaining hardware I will likely leave it in place unless significantly loose.  We discussed the risks and benefits of surgical intervention, including the risks of pain, bleeding, infection, refracture through the screw holes, and stiffness.  Given her history of dementia and difficulty complying with splints and braces, her daughter requested a cast postoperatively and I do think that this is likely prudent as a precaution, particularly if I remove all of the screws.  We will tentatively plan to perform this under a peripheral nerve block plus MAC at the next available  date and I will see her back at 2 weeks postoperative for wound check.  Despite history of dementia, the daughter confirms that this patient does have power to consent herself and she voiced understanding of all risks and benefits discussed today and signed consent today.

## 2021-08-01 ENCOUNTER — Encounter
Admit: 2021-08-01 | Payer: PRIVATE HEALTH INSURANCE | Attending: Student in an Organized Health Care Education/Training Program

## 2021-08-01 ENCOUNTER — Ambulatory Visit: Admit: 2021-08-01 | Payer: BLUE CROSS/BLUE SHIELD

## 2021-08-01 ENCOUNTER — Inpatient Hospital Stay
Admit: 2021-08-01 | Discharge: 2021-08-01 | Payer: BLUE CROSS/BLUE SHIELD | Attending: Student in an Organized Health Care Education/Training Program

## 2021-08-01 ENCOUNTER — Ambulatory Visit: Admit: 2021-08-01 | Payer: BLUE CROSS/BLUE SHIELD | Attending: Anesthesiology

## 2021-08-01 DIAGNOSIS — M65831 Other synovitis and tenosynovitis, right forearm: Secondary | ICD-10-CM

## 2021-08-01 DIAGNOSIS — D151 Benign neoplasm of heart: Secondary | ICD-10-CM

## 2021-08-01 DIAGNOSIS — W19XXXA Unspecified fall, initial encounter: Secondary | ICD-10-CM

## 2021-08-01 DIAGNOSIS — E7800 Pure hypercholesterolemia, unspecified: Secondary | ICD-10-CM

## 2021-08-01 DIAGNOSIS — Z7901 Long term (current) use of anticoagulants: Secondary | ICD-10-CM

## 2021-08-01 DIAGNOSIS — T84122A Displacement of internal fixation device of bone of right forearm, initial encounter: Secondary | ICD-10-CM

## 2021-08-01 DIAGNOSIS — Z79899 Other long term (current) drug therapy: Secondary | ICD-10-CM

## 2021-08-01 DIAGNOSIS — E119 Type 2 diabetes mellitus without complications: Secondary | ICD-10-CM

## 2021-08-01 DIAGNOSIS — I639 Cerebral infarction, unspecified: Secondary | ICD-10-CM

## 2021-08-01 DIAGNOSIS — Z87891 Personal history of nicotine dependence: Secondary | ICD-10-CM

## 2021-08-01 DIAGNOSIS — I10 Essential (primary) hypertension: Secondary | ICD-10-CM

## 2021-08-01 DIAGNOSIS — Z8673 Personal history of transient ischemic attack (TIA), and cerebral infarction without residual deficits: Secondary | ICD-10-CM

## 2021-08-01 DIAGNOSIS — Z96631 Presence of right artificial wrist joint: Secondary | ICD-10-CM

## 2021-08-01 DIAGNOSIS — Z7984 Long term (current) use of oral hypoglycemic drugs: Secondary | ICD-10-CM

## 2021-08-01 DIAGNOSIS — F0390 Unspecified dementia without behavioral disturbance: Secondary | ICD-10-CM

## 2021-08-01 DIAGNOSIS — C801 Malignant (primary) neoplasm, unspecified: Secondary | ICD-10-CM

## 2021-08-01 MED ORDER — SODIUM CHLORIDE 0.9 % (FLUSH) INJECTION SYRINGE
0.9 % | INTRAVENOUS | Status: DC | PRN
Start: 2021-08-01 — End: 2021-08-02

## 2021-08-01 MED ORDER — HYDRALAZINE 20 MG/ML INJECTION SOLUTION
20 mg/mL | INTRAVENOUS | Status: DC | PRN
Start: 2021-08-01 — End: 2021-08-01
  Administered 2021-08-01: 19:00:00 20 mg/mL via INTRAVENOUS

## 2021-08-01 MED ORDER — LIDOCAINE 1% + MARCAINE 0.25% (1:1) (MIXTURE)
Status: DC | PRN
Start: 2021-08-01 — End: 2021-08-01
  Administered 2021-08-01: 19:00:00 7.000 mL via INTRAMUSCULAR

## 2021-08-01 MED ORDER — PROPOFOL 10 MG/ML INTRAVENOUS EMULSION
10 mg/mL | INTRAVENOUS | Status: DC | PRN
Start: 2021-08-01 — End: 2021-08-01
  Administered 2021-08-01: 19:00:00 10 mL/h via INTRAVENOUS

## 2021-08-01 MED ORDER — TRAMADOL 50 MG TABLET
50 mg | ORAL_TABLET | Freq: Four times a day (QID) | ORAL | 1 refills | 3.00 days | Status: AC | PRN
Start: 2021-08-01 — End: ?
  Filled 2021-08-01: qty 10, 3d supply, fill #0
  Filled 2021-08-01: qty 10, 3d supply, fill #1

## 2021-08-01 MED ORDER — LACTATED RINGERS INTRAVENOUS SOLUTION
INTRAVENOUS | Status: DC | PRN
Start: 2021-08-01 — End: 2021-08-01
  Administered 2021-08-01: 18:00:00 via INTRAVENOUS

## 2021-08-01 MED ORDER — APIXABAN 2.5 MG TABLET
2.5 mg | ORAL_TABLET | Freq: Two times a day (BID) | ORAL | 1 refills | Status: AC
Start: 2021-08-01 — End: ?

## 2021-08-01 MED ORDER — PROPOFOL 10 MG/ML INTRAVENOUS EMULSION
10 mg/mL | INTRAVENOUS | Status: DC | PRN
Start: 2021-08-01 — End: 2021-08-01
  Administered 2021-08-01 (×2): 10 mg/mL via INTRAVENOUS

## 2021-08-01 MED ORDER — FENTANYL (PF) 50 MCG/ML INJECTION SOLUTION
50 mcg/mL | Status: CP
Start: 2021-08-01 — End: ?

## 2021-08-01 MED ORDER — CHLORHEXIDINE GLUCONATE 0.12 % MOUTHWASH
0.12 % | Status: DC
Start: 2021-08-01 — End: 2021-08-02

## 2021-08-01 MED ORDER — CEFAZOLIN 1 GRAM SOLUTION FOR INJECTION
1 gram | INTRAVENOUS | Status: DC | PRN
Start: 2021-08-01 — End: 2021-08-01
  Administered 2021-08-01: 19:00:00 1 gram via INTRAVENOUS

## 2021-08-01 MED ORDER — FENTANYL (PF) 50 MCG/ML INJECTION SOLUTION
50 mcg/mL | INTRAVENOUS | Status: DC | PRN
Start: 2021-08-01 — End: 2021-08-02

## 2021-08-01 MED ORDER — HYDROMORPHONE 0.5 MG/0.5 ML INJECTION SYRINGE
0.5 mg/ mL | INTRAVENOUS | Status: DC | PRN
Start: 2021-08-01 — End: 2021-08-02

## 2021-08-01 MED ORDER — OXYCODONE IMMEDIATE RELEASE 5 MG TABLET
5 mg | Freq: Once | ORAL | Status: CP
Start: 2021-08-01 — End: ?
  Administered 2021-08-01: 21:00:00 5 mg via ORAL

## 2021-08-01 MED ORDER — HYDROMORPHONE (PF) 0.2 MG/ML INJECTION SYRINGE
0.2 mg/mL | INTRAVENOUS | Status: DC | PRN
Start: 2021-08-01 — End: 2021-08-02
  Administered 2021-08-01: 20:00:00 0.2 mL via INTRAVENOUS

## 2021-08-01 MED ORDER — NALOXONE 0.4 MG/ML INJECTION SOLUTION
0.4 mg/mL | INTRAVENOUS | Status: DC | PRN
Start: 2021-08-01 — End: 2021-08-02

## 2021-08-01 MED ORDER — CHLORHEXIDINE GLUCONATE 0.12 % MOUTHWASH
0.12 % | Freq: Once | OROMUCOSAL | Status: CP
Start: 2021-08-01 — End: ?
  Administered 2021-08-01: 17:00:00 0.12 mL via OROMUCOSAL

## 2021-08-01 MED ORDER — DIPHENHYDRAMINE 50 MG/ML INJECTION (WRAPPED E-RX)
50 mg/mL | INTRAVENOUS | Status: DC | PRN
Start: 2021-08-01 — End: 2021-08-02

## 2021-08-01 MED ORDER — SODIUM CHLORIDE 0.9 % (FLUSH) INJECTION SYRINGE
0.9 % | Freq: Three times a day (TID) | INTRAVENOUS | Status: DC
Start: 2021-08-01 — End: 2021-08-02

## 2021-08-01 MED ORDER — LIDOCAINE (PF) 20 MG/ML (2 %) INTRAVENOUS SOLUTION
20 mg/mL (2 %) | INTRAVENOUS | Status: DC | PRN
Start: 2021-08-01 — End: 2021-08-01
  Administered 2021-08-01: 18:00:00 20 mg/mL (2 %) via INTRAVENOUS

## 2021-08-01 MED ORDER — SODIUM CHLORIDE 0.9 % IRRIGATION SOLUTION
0.9 % irrigation | Status: CP | PRN
Start: 2021-08-01 — End: ?
  Administered 2021-08-01: 18:00:00 0.9 % irrigation

## 2021-08-01 MED ORDER — FENTANYL (PF) 50 MCG/ML INJECTION SOLUTION
50 mcg/mL | INTRAVENOUS | Status: DC | PRN
Start: 2021-08-01 — End: 2021-08-01
  Administered 2021-08-01 (×3): 50 mcg/mL via INTRAVENOUS

## 2021-08-01 MED ORDER — ONDANSETRON HCL (PF) 4 MG/2 ML INJECTION SOLUTION
4 mg/2 mL | INTRAVENOUS | Status: DC | PRN
Start: 2021-08-01 — End: 2021-08-02

## 2021-08-01 MED ORDER — CHLORHEXIDINE GLUCONATE 2 % TOWELETTE
2 % | Freq: Once | TOPICAL | Status: DC
Start: 2021-08-01 — End: 2021-08-02

## 2021-08-01 MED ORDER — OXYCODONE IMMEDIATE RELEASE 5 MG TABLET
5 mg | Status: DC
Start: 2021-08-01 — End: 2021-08-02

## 2021-08-01 NOTE — Interval H&P Note
Subsequent to admission for surgery or invasive procedure, I have reassessed the patient by examination and review of relevant data pertaining to the planned procedure. I have verified the planned procedure and there are no relevant changes since the H&P.CTAB, RRR.I discussed again with the patient and her daughter that we will plan to remove the loose screw and examine the remaining hardware.  If there is evidence of loosening and we have the appropriate screwdriver, I will remove the remaining hardware as needed.  Discussed risks and benefits including the risk of bleeding, pain, infection, stiffness, re-fracture, and the potential need to leave remaining hardware in place.  All questions were answered.  I will see them back on 04/20 for suture removal.  The patient's daughter tells me that she will remove any postoperative dressing placed on her today, so we will plan to cast her postoperatively to protect the wound.

## 2021-08-01 NOTE — Other
Operative Diagnosis:Pre-op:   Presence of right artificial wrist joint [Z96.631] Patient Coded Diagnosis   Pre-op diagnosis: Presence of right artificial wrist joint  Post-op diagnosis: Presence of right artificial wrist joint  Patient Diagnosis   Pre-op diagnosis: Presence of right artificial wrist joint [Z96.631]  Post-op diagnosis:     Post-op diagnosis:   * Presence of right artificial wrist joint [Z96.631]Operative Procedure(s) :Procedure(s) (LRB):RIGHT WRIST REMOVAL OF IMPLANT DEEP (EG, BURIED WIRE. PIN, SCREW,METAL BAND,NAIR, ROD OR PLATE) (Right)Post-op Procedure & Diagnosis ConfirmationPost-op Diagnosis: Post-op Diagnosis confirmed (no changes)Post-op Procedure: Post-op Procedure confirmed (no changes)

## 2021-08-01 NOTE — Anesthesia Pre-Procedure Evaluation
This is a 87 y.o. female scheduled for RIGHT WRIST REMOVAL OF IMPLANT DEEP (EG, BURIED WIRE. PIN, SCREW,METAL BAND,NAIR, ROD OR PLATE) (Right).Review of Systems/ Medical HistoryPatient summary, nursing notes, EKG/Cardiac Studies , Labs, pre-procedure vitals, height, weight and NPO status reviewed.No previous anesthesia concernsAnesthesia Evaluation: Estimated body mass index is 23.62 kg/m? as calculated from the following:  Height as of this encounter: 5' 1 (1.549 m).  Weight as of this encounter: 56.7 kg. Past Surgical History:  CARDIAC SURGERY	HYSTERECTOMYCATARACT EXTRACTION	WRIST SURGERYCOLONOSCOPY	Cardiovascular:Patient has a history of: hypercholesterolemia and hypertension. Neuromuscular:    Patient has a history of dementia.-Intracranial disorders:  She had a cerebrovascular accidentEndocrine/Metabolic: -Diabetes mellitus:  Patient has diabetes mellitus type 2. Her diabetes is well controlled.Physical ExamCardiovascular:    normal exam  Pulmonary:  normal exam  Airway:  Mallampati: IITM distance: >3 FBNeck ROM: fullMouth Opening: >3cmAnesthesia PlanASA 3 The primary anesthesia plan is  general LMA. Perioperative Code Status confirmed: It is my understanding that the patient is currently designated as 'Full Code' and will remain so throughout the perioperative period.Anesthesia informed consent obtained. Consent obtained from: patientUse of blood products: consented  The post operative pain plan is IV analgesics and per surgeon management.Plan discussed with CRNA.Anesthesiologist's Pre Op NoteI personally evaluated and examined the patient prior to the intra-operative phase of care on the day of the procedure.Marland Kitchen

## 2021-08-01 NOTE — Other
Post Anesthesia Transfer of Care NotePatient: Danielle Comment DavisProcedure(s) Performed: Procedure(s) (LRB):RIGHT WRIST REMOVAL OF IMPLANT DEEP (EG, BURIED WIRE. PIN, SCREW,METAL BAND,NAIR, ROD OR PLATE) (Right) Patient location: PACU Last Vitals: Vitals Value Taken Time BP 116/61 08/01/21 1533 Temp  08/01/21 1534 Pulse 66 08/01/21 1534 Resp 12 08/01/21 1534 SpO2 100 % 08/01/21 1534 Vitals shown include unvalidated device data.Level of consciousness: awake and alert Transport Vital Signs:  Stable since the last set of recorded intra-operative vital signsIntra-operative Complications: noneIntra-operative Intake & Output and Antibiotics as per Anesthesia record and discussed with the RN.

## 2021-08-01 NOTE — Anesthesia Post-Procedure Evaluation
Anesthesia Post-op NotePatient: Danielle Comment DavisProcedure(s):  Procedure(s) (LRB):RIGHT WRIST REMOVAL OF IMPLANT DEEP (EG, BURIED WIRE. PIN, SCREW,METAL BAND,NAIR, ROD OR PLATE) (Right) Patient location: PACULast Vitals:  I have noted the vital signs as listed in the nursing notes.Mental status recovered: patient participates in evaluation: YesVital signs reviewed: YesRespiratory function stable:YesAirway is patent: YesCardiovascular function and hydration status stable: YesPain control satisfactory: YesNausea and vomiting control satisfactory:YesThere were no known notable events for this encounter.

## 2021-08-01 NOTE — Other
[facility] - DO NOT COPY WITHOUT APPROPRIATE AUTHORIZATION Name: Danielle Campos MRN: [id number] Date of Birth: 20-Jun-1934 DATE OF PROCEDURE/SURGERY: 08/01/2021  OPERATION:Removal of hardware right wrist SURGEON:Seif Teichert Carlye Grippe MD ASSISTANT:Ali Ali Lowe MS ANESTHESIOLOGIST:Arsenio Bustos MD ANESTHESIA:General PREOP DIAGNOSIS:Loose hardware, flexor tendonitis POSTOP DIAGNOSIS:Same as above ESTIMATED BLOOD LOSS:10 cc. SPECIMENS REMOVED: Right volar wrist plate, screws x 9 FLUIDS:  200cc of crystalloid. DRAINS:  None.IMPLANTS: NoneTourniquet time: 30 minutes INDICATIONS:  The patient is a 87 year old female with history of type 2 diabetes, hypertension, and dementia, who presented to me with her daughter for ongoing right wrist pain, with x-rays revealing of significant back out of a radial styloid screw from a prior distal radius plate.  I recommended removal of hardware and brace immobilization, but upon meeting with her primary care physician she had some changes on EKG which required her to undergo stress testing and echocardiogram.  She since completed these and her cardiologist provided clearance for removal of hardware today.  I met with the patient and her daughter again at bedside today and we discussed risks and benefits of surgery including the risks of pain, bleeding, infection, refracture through the screw sites, and potential retained hardware if I did not have the appropriate instruments for hardware removal or if there was very sturdy bony fixation.  They consented to proceed. PROCEDURE:  Patient was brought to the operating room and laid supine on the operating room table for the administration of general anesthesia.  A time-out was performed, confirming patient identity, operative site, and procedure, and she received 2 g of IV cefazolin approximately 20 minutes prior to the incision.  A nonsterile 18 in tourniquet was placed to the right axilla.   The right upper extremity was elevated and prepped and draped in usual sterile fashion with a chlorhexidine prescrub, followed by ChloraPrep.  The previous incision of the volar aspect of the right wrist was marked and this area was infiltrated with a total of 7 cc of 1% lidocaine plain with 0.5% bupivacaine plain.  The existing incision was made sharply through skin and subcutaneous tissue and tenotomy scissor was used to spread and identify the underlying flexor carpi radialis.  This was easily retracted ulnarly, and the subsheath was incised with immediate exposure of the very prominent loose styloid screw.  This was removed with a needle driver.  We then completed the exposure by retracting the FCR ulnarly, and there was a very small amount of residual pronator quadratus fibers overlying the plate, which were cleared sharply with 15 blade.  The distal radius plate was then exposed.  There were 6 additional distal screws, two of which were slightly loose but without significant back out.  Given the back out of the styloid screw, and the fact that he is relative slightly loose, decision was made to remove all existing hardware.  A small sq tip driver was used to remove all of the distal screws, and a 2.5 square tip driver was used to remove the shaft screws.  The most proximal screw did appear to have a small amount of synovitis around it with metallic discoloration.  After removal of all screws, the plate was elevated from the bone with osteotome and all hardware sent as specimen.  The residual wound was inspected and there was no evidence of purulence or signs of ongoing infection and decision was made not to send additional cultures.  Several small ridges of bone from around screw sites were carefully rongeured.  Final images were  taken to confirm complete removal of hardware.  The wound was then copiously irrigated with normal saline.  Tourniquet was deflated and hemostasis achieved. The wound was closed with 3-0 Vicryl in the subcutaneous tissue followed by a running 4-0 Monocryl.  The wound was then dressed with Dermabond, Steri-Strips, gauze, and a well-padded short-arm cast given the patient's history of removing dressings.The pt was then awoken from anesthesia and transferred to the recovery room in stable condition.  All counts were correct at the end of the case.

## 2021-08-01 NOTE — Discharge Instructions
Orthopedic Discharge InstructionsFollow up: Please follow up with Dr. Carlye Grippe as scheduled. Call the office at [phone] with questions.General questions: Please call if there are any questions or concerns. If you develop worsening pain, redness, numbness, weakness, coolness, bleeding, or swelling to surgical site or extremity, call our office. Call our office if you have a fever. Do not hesitate to go to the ER if you are concerned.Pain management: Please take tylenol every 6 hours as needed for pain. Do not drink alcohol when taking tylenol. Elevation of the affected extremity and ice are effective in reducing pain. Narcotics for breakthrough pain: Please take tramadol 1-2 tablets every 4-6 hours as needed. Do not drive or operate heavy machinery while taking this medication. Narcotics cause constipation, so take senna-plus (a stool softener) twice daily when taking this medication.Wound Care: Any sutures or staples are to be removed at the follow-up visit. Leave the dressing in place until follow-up, when you can take it down. Once incision completely dry for 24 to 48 hours may be left open to air, unless contraindicated. Bathing: You may shower if able to provide a water tight seal above dressing with a shower type bag. Otherwise, sponge bathe for now. Once the dressing is removed, you may shower normally. Do not scrub the wound. Do not put any cream, lotion or ointment on incision(s). Do not soak wound (no swimming).Post-operative care: You may continue ice to operative site as needed. Continue elevation of right extremity as directed when in bed or chair to prevent swelling and reduce pain.

## 2022-01-06 ENCOUNTER — Other Ambulatory Visit: Payer: Self-pay | Admitting: Neurology

## 2022-06-10 ENCOUNTER — Emergency Department: Admit: 2022-06-10 | Payer: BLUE CROSS/BLUE SHIELD

## 2022-06-10 ENCOUNTER — Inpatient Hospital Stay: Admit: 2022-06-10 | Discharge: 2022-06-10 | Payer: BLUE CROSS/BLUE SHIELD | Attending: Emergency Medicine

## 2022-06-10 ENCOUNTER — Emergency Department: Admit: 2022-06-10 | Payer: BLUE CROSS/BLUE SHIELD | Attending: Diagnostic Radiology

## 2022-06-10 DIAGNOSIS — Z043 Encounter for examination and observation following other accident: Secondary | ICD-10-CM

## 2022-06-10 DIAGNOSIS — S0093XA Contusion of unspecified part of head, initial encounter: Secondary | ICD-10-CM

## 2022-06-10 LAB — BASIC METABOLIC PANEL
BKR ANION GAP: 14 (ref 7–17)
BKR BLOOD UREA NITROGEN: 12 mg/dL (ref 8–23)
BKR BUN / CREAT RATIO: 10 (ref 8.0–23.0)
BKR CALCIUM: 9.2 mg/dL — ABNORMAL HIGH (ref 8.8–10.2)
BKR CO2: 27 mmol/L (ref 20–30)
BKR CREATININE: 1.2 mg/dL (ref 0.40–1.30)
BKR EGFR, CREATININE (CKD-EPI 2021): 44 mL/min/{1.73_m2} — ABNORMAL LOW (ref >=60–12.0)
BKR GLUCOSE: 193 mg/dL — ABNORMAL HIGH (ref 70–100)
BKR POTASSIUM: 4 mmol/L (ref 3.3–5.3)
BKR SODIUM: 145 mmol/L — ABNORMAL HIGH (ref 136–144)
BKR WAM LYMPHOCYTES: 10 % — ABNORMAL LOW (ref 8.0–23.0)
BKR WAM MCHC: 14 g/dL (ref 7–17)

## 2022-06-10 LAB — CBC WITH AUTO DIFFERENTIAL
BKR CHLORIDE: 84.5 fL (ref 80.0–100.0)
BKR WAM ABSOLUTE IMMATURE GRANULOCYTES.: 0.06 x 1000/??L (ref 0.00–0.30)
BKR WAM ABSOLUTE LYMPHOCYTE COUNT.: 1.51 x 1000/??L (ref 0.60–3.70)
BKR WAM ABSOLUTE NRBC (2 DEC): 0 x 1000/ÂµL (ref 0.00–1.00)
BKR WAM ANALYZER ANC: 6.67 x 1000/??L (ref 2.00–7.60)
BKR WAM BASOPHIL ABSOLUTE COUNT.: 0.01 x 1000/??L (ref 0.00–1.00)
BKR WAM BASOPHILS: 0.1 % (ref 0.0–1.4)
BKR WAM EOSINOPHIL ABSOLUTE COUNT.: 0.07 x 1000/ÂµL (ref 0.00–1.00)
BKR WAM EOSINOPHILS: 0.8 % (ref 0.0–5.0)
BKR WAM HEMATOCRIT (2 DEC): 37 % (ref 35.00–45.00)
BKR WAM HEMOGLOBIN: 12.3 g/dL — ABNORMAL HIGH (ref 11.7–15.5)
BKR WAM IMMATURE GRANULOCYTES: 0.7 % (ref 0.0–1.0)
BKR WAM MCH (PG): 28.1 pg (ref 27.0–33.0)
BKR WAM MCV: 84.5 fL (ref 80.0–100.0)
BKR WAM MONOCYTE ABSOLUTE COUNT.: 0.62 x 1000/ÂµL (ref 0.00–1.00)
BKR WAM MONOCYTES: 6.9 % (ref 4.0–12.0)
BKR WAM MPV: 11 fL (ref 8.0–12.0)
BKR WAM NEUTROPHILS: 74.6 % — ABNORMAL HIGH (ref 39.0–72.0)
BKR WAM NUCLEATED RED BLOOD CELLS: 0 % (ref 0.0–1.0)
BKR WAM PLATELETS: 268 x1000/ÂµL (ref 150–420)
BKR WAM RDW-CV: 13.5 % — ABNORMAL HIGH (ref 11.0–15.0)
BKR WAM RED BLOOD CELL COUNT.: 4.38 M/ÂµL (ref 4.00–6.00)
BKR WAM WHITE BLOOD CELL COUNT: 8.9 x1000/ÂµL (ref 4.0–11.0)

## 2022-06-10 MED ORDER — DROPERIDOL 2.5 MG/ML INJECTION SOLUTION
2.5 mg/mL | Freq: Once | INTRAMUSCULAR | Status: CP
Start: 2022-06-10 — End: ?
  Administered 2022-06-10: 23:00:00 2.5 mg/mL via INTRAMUSCULAR

## 2022-06-10 MED ORDER — ACETAMINOPHEN 325 MG TABLET
325 mg | Freq: Once | ORAL | Status: CP
Start: 2022-06-10 — End: ?
  Administered 2022-06-11: 01:00:00 325 mg via ORAL

## 2022-06-10 MED ORDER — DROPERIDOL 2.5 MG/ML INJECTION SOLUTION
2.5 mg/mL | Freq: Once | INTRAMUSCULAR | Status: CP
Start: 2022-06-10 — End: ?
  Administered 2022-06-11: 01:00:00 2.5 mg/mL via INTRAMUSCULAR

## 2022-06-10 MED ORDER — LIDOCAINE 4 % TOPICAL PATCH
4 % | TRANSDERMAL | Status: DC
Start: 2022-06-10 — End: 2022-06-11

## 2022-06-10 NOTE — ED Notes
SOCIAL WORK ASSESSMENTPatient Name: CLORETTA MOLINAR Record Number: [id number] Date of Birth: 05/30/1936Medical Social Work Assessment Adult  Flowsheet Row Most Recent Value Admission Information  Document Type Clinical Assessment - Able to Assess (For Inpatient/ED Only) Prior psychosocial assessment has been documented within this hospitalization No (For Inpatient/ED Only) Prior psychosocial assessment has been documented within 30 days of this hospitalization No Reason for Current Social Work Involvement Support/Coping Source of Information Medical Team Record Reviewed Yes Level of Care Emergency Department Assessment has been completed within 30 days of this encounter  (For Inpatient/ED Only) Prior psychosocial assessment has been documented within 30 days of this hospitalization No Relationships  Marital Status Divorced  [per face sheet] Lives With Unable to assess Family circumstances unable to assess Abuse Screen (yes response referral indicated)  Able to respond to abuse questions No Language needed None, Patient Speaks English Literacy Unable to assess Special Needs has dementia Social Determinants of Health  Financial Concerns Unable to Assess What is your living situation today? I have a steady place to live Mental Status  Mental Status Able to Assess Appearance appears stated age Attitude/Demeanor/Rapport reactive Affect (typically observed) overstimulated Orientation disoriented to situation, disoriented to time, disoriented to place Insight Poor Health Insight/Judgment concern for ability to make health care decisions, concern for capacity to make sound judgments, lacks insight into situation, concern for ability to advocate for health care needs, concern for ability to manage health care needs Reaction to Event/Health Status Unable to assess Injured Trauma Survivor Screen  Patient is able to answer all nine questions. No Suicide Risk Assessment  Reason for Assessment Utilizing SAFE-T and C-SSRS (Check all that apply) Social Work Consult/Assessment C-SSRS Unable to Assess Specific Questions about Thoughts, Plans, Suicidal Intent (SAFE-T) Unable to Assess Risk Assessment  Access to Lethal Methods?  (firearm in home or access/presence of other lethal methods) Unable to assess Risk Assessment Unable to Assess Cause for concern Unable to Assess Substance Use  Active substance use Unable to assess Coping  Reaction to Event/Health Status Unable to assess Formulation: Recommendation(s) and Intervention(s) (including for discharge to occur)  Psychosocial issues requiring intervention Modified trauma Psychosocial interventions 5 minutes face to face. Observation of medical team only. Modified trauma assessment was not completed due to dementia. No family is currently present. According to her chart she fell backwards down 6 steps, and she may have tripped over a dog (?) She is only orientated to self. Snigdha was been soothed by the ED techs, as she screams out for no apparent reason. She needs constant redirection for her loud outbursts. Academic librarian- ED tech Specific referrals to enhance community supports (include existing and new resources) none Handoff Required? Yes Next Steps/Plan (including hand-off): Consult for in-patient Social Work. Signature: Genevie Cheshire Contact Information: [phone]

## 2022-06-10 NOTE — ED Notes
4:57 PM Aline August, RNPatient BIBA as a modified trauma for a fall down six steps,due to tripping over a dog. hx multiple mechanical fall and dementia. Positive head strike, and on eliquis. Negative LOC per daughter. Screams at baseline per EMS who got history from daughter on scene. BS 201. Has swelling on the posterior occiput. See trauma narrator for more details. AXO x 1 only. Provider Policastro at bedside. Patient arrives with collar by EMS. 5:08 PMFAST at bedside. Patient screaming, mommy!Marland Kitchen 5:09 PMPer provider Policastro okay to defer for urine at this time. 5:13 PMFAST negative.

## 2022-06-10 NOTE — ED Notes
5:56 PM Report received, care assumed. Transfer from R room. BIBA from home, mechanic trip and fall down 6 stairs. Pending imaging. Alert/oriented to self only, c-collar in place. Repeatedly calling out.Chief Complaint Patient presents with  Fall   Fall backwards down six steps. +head strike with large occipital hematoma. +eloquis. Pt has dementia at baseline, is altered now 6:52 PM Pending imaging. Repeated episodes of calling out requiring frequent redirection. Medicated with no effect. Pending [place] at this time.

## 2022-06-11 DIAGNOSIS — E119 Type 2 diabetes mellitus without complications: Secondary | ICD-10-CM

## 2022-06-11 DIAGNOSIS — Z8673 Personal history of transient ischemic attack (TIA), and cerebral infarction without residual deficits: Secondary | ICD-10-CM

## 2022-06-11 DIAGNOSIS — F0390 Unspecified dementia without behavioral disturbance: Secondary | ICD-10-CM

## 2022-06-11 DIAGNOSIS — I10 Essential (primary) hypertension: Secondary | ICD-10-CM

## 2022-06-11 DIAGNOSIS — Z7901 Long term (current) use of anticoagulants: Secondary | ICD-10-CM

## 2022-06-11 LAB — ALCOHOL PANEL, GC (BH LM YH)
BKR ACETONE BLOOD2: NOT DETECTED mg/dL
BKR ETHANOL2 (GC): NOT DETECTED mg/dL
BKR ISOPROPANOL BLOOD2: NOT DETECTED mg/dL
BKR METHANOL2: NOT DETECTED mg/dL

## 2022-06-11 LAB — REFLEX LACTIC ACID, PLASMA: BKR LACTATE: 1.8 mmol/L (ref 0.5–2.2)

## 2022-06-11 NOTE — Care Coordination-Inpatient
HCS have been secured through [facility]. Daughter Debbie has been notifiEunice Blaseand agency contact information has been given to daughter.Noemie Devivo RN, MCas[facility] [employer]

## 2022-06-11 NOTE — Plan of Care
[facility]scSpiritual Care NoteChaplain visit with patient, Danielle Campos in trauma room. No visitors present.  She presented as anxious yelling out.    She welcomed chaplain's presence and became calm during prayer and while chaplain at bedside. Assessment:  Religion: ChristianIntervention:  Referral Source: Protocol; Other (Comment) (modified trauma)Responding Chaplain: On-Call ChaplainConsulted With: NurseLanguage or Special Accommodation Rendered?: NoVisit and Intervention Type: Spiritual Visit Spiritual care interventions provided: Prayer, Empathy; Spiritual Support/Presence Outcome:    Plan: Follow-Up Visit Needed: Danielle Campos, M.Div., Th.M.Per-Diem ChaplainOn-Call ChaplainYSC [phone] [phone] Chaplains are available 24/7 for emotional, spiritual, religious, and existential support for all patients, their loved ones, and staff - for those who are religious and those who are not.When to Call a Chaplain:N: New diagnosis E: Emotional / spiritual distress E: Existential distress D: Decision making / goals of care meetingsS: Support for staff and patients' loved ones   C: Compromised copingA: Anxiety / stress / grief / loneliness R: Religious / cultural / ritual needsE: End-of-life care / death/dying03-07-24 7:14 PM

## 2022-06-11 NOTE — Care Coordination-Inpatient
88 y.o. female with significant medical history of DM2, Dementia, HTN, Stroke on Eliquis, presenting as MTR.  Mechanical fall at home, fell backwards and struck head. Patient had no LOC. Patient lives with her daughter in [place]. No known services prior to admission. Spoke with daughter, Gavin Pound who confirms that the patient lives with her in [place]. She states that they recently moved to [place] in the last year and that the patient still owns a home in [place]. She states that she is very overwhelmed at home and has completely devoted her life currently to taking care of her mother at home. She states that her Mom is blind on the left side and struggles with more ADLs and IADLs at home. She states that her dementia is also getting worse and she is having a hard time expressing her throughts and needs and loses track of words and gets angry easily. She states that she spoke with someone at [facility] but did not complete an application because she was told she wouldn't qualify due to income. I advised that completing an application to even get a care manager assigned or other asetts such as adult daycare might be beneficial, she verbalized understanding, info added to AVS. We also discussed homecare services. She is open to SN, home PT, OT, HHA and MSW for dc- PCP is Counselling psychologist (PA) in [place], no preference in agency, refs sent. We discussed what insurance will cover/not cover, as patient also has high monthly income we discussed the possibility of ALF placement as well. Patient is independent in ambulation because DME tends to confuse her more than help her. She also has been recently to the [facility] office in [place] for treatment of behaviors where they increased her seroquel but it did not seem to help. Plan: medical work up in progress. Pending imaging. Daughter in bedside. Assessment screening completed. Care management will continue to follow patient's progress and discuss plan of care with treatment team. Discharge needs not determined at this time. Case Management Screening  Flowsheet Row Most Recent Value Case Management Screening: Chart review completed. If YES to any question below then proceed to CM Eval/Plan  Is there a change in their cognitive function Yes Do you anticipate a change in this patient's physicial function that will effect discharge needs? Yes Has there been a readmission within the last 30 days No Were there services prior to admission ( Examples: Assisted Living, HD, Homecare, Extended Care Facility, Methadone, SNF, Outpatient Infusion Center) No Negative/Positive Screen Positive Screening: Complete CM Evaluation and Plan Case Manager Attestation  I have reviewed the medical record and completed the above screen. CM staff will follow patient's progress and discuss the plan of care with the Treatment Team. Yes  Case Management Evaluation  Flowsheet Row Most Recent Value Case Management Evaluation and Plan  Arrived from prior to admission home/apartment/condo Do you have a caregiver, or do you anticipate the need for a caregiver given the change in your physicial function? No  Lives With Child(ren), Adult Services Prior to Admission none Patient Requires Care Coordination Intervention Due To discharge planning needs/concerns, change in physical function Prior to Hospitalization: Assistance Needed/DME being used None Documented Insurance Accurate Yes Any financial concerns related to anticipated discharge needs No Patient's home address verified Yes Patient's PCP of record verified Yes Living Environment   Lives With Child(ren), Adult Source of Clinical History  Patient's clinical history has been reviewed and source of Information is: [employer] fast streamer, Medical record, Child(ren) Case Psychologist, occupational  I have reviewed the medical record and completed the above evaluation with the following recommendations. Yes Discharge Planning Coordination Recommendations  Discharge Planning Coordination Recommendations Needs not determined at this time Case Manager reviewed plan of care/ continuum of care need's with  Interdisciplinary Team, Transitional care rounds  Chancy Milroy, CNL, MSN, RN, BSRN Case ManagerYale Princeton Endoscopy Center LLC

## 2022-06-11 NOTE — ED Notes
7:02 PM [facility] bed ordered for patient.

## 2022-06-11 NOTE — Telephone Encounter
Message from ED provider requesting f/u on Danielle Campos s/p fall and her f/u home care needs.  CM note reviewed.  Message sent to [facility] staff requesting f/u on this to see if a referral was placed. Message from [employer] RN CM and she will f/u on this.

## 2022-06-11 NOTE — Discharge Instructions
You were seen in the emergency department for evaluation after a fall.  You had no injuries, your workup was reassuring.  The care coordinators started the process of setting up with home resources.  Due to the weather, delays may occur, someone should be reaching out from the hospital within the next several days to help you facilitate those resources.

## 2022-06-11 NOTE — ED Notes
7:40 PM Pt arrives to ed after fall + head strike, pt on elequis. Pt family at bedside and states pt is at baseline mentation. Pt alert to self.

## 2022-06-11 NOTE — Telephone Encounter
Pend Courtesy call to patient after ED discharge to see how she is feeling and encourage follow-up.  Reached patient's daughter Gavin Pound who is patient's primary caregiver, patient with significant dementia, she is interested in inquiring about PT services or other assistance at home. Glenna RN from care coordination has been contacted and will be reaching out to the patient and Gavin Pound was notified to expect a call from her, if she does not receive a call by 3:00 p.m., she was instructed to call the follow-up office for assistance and she verbalized understanding. MHB message to [facility] RN to ensure follow-up.

## 2022-06-11 NOTE — ED Provider Notes
Chief Complaint Patient presents with ? Fall   Fall backwards down six steps. +head strike with large occipital hematoma. +eloquis. Pt has dementia at baseline, is altered now Emergency Medicine Resident MDMSynopsis: 88 y.o. female with significant medical history of DM2, Dementia, HTN, Stroke on Eliquis, presenting as MTR.  Mechanical fall at home, fell backwards and struck head. Patient had no LOC. On exam, primary intact, patient at her baseline mental status, however GCS 13 2/2 memory impairment. Secondary significant for swelling to the occiput/ scalp, midface stable, no racoon eyes, no blood in oropharynx, midline trachea, bilateral lung sounds, no chest wall tenderness, no tspine or lspine tenderness, rom intact in all extremities, 5/5 strength and intact sensation, abdomen soft nontender. Concern for ICH. Traumatic fractures. Unlikely underlying toxic/metabolic or medical etiology that precipitated fall as patient otherwise at baseline. Plan for [place] head cspine, cxr and pelvis. Vitals:  06/10/22 1703 BP: (!) 228/101 Pulse: 87 Resp: 18 Temp: (!) 96.8 ?F (36 ?C) Patient care discussed in detail with Dr. Riley Nearing note was created using M-Modal dictation.------------------------------------Alexa Policastro, MD	Emergency Medicine PGY-42/03/2023 4:57 PMMDM  Physical ExamED Triage VitalsBP: n/aPulse: n/aPulse from  O2 sat: n/aResp: n/aTemp: n/aTemp src: n/aSpO2: n/a BP (!) 228/101  - Pulse 87  - Temp (!) 96.8 ?F (36 ?C) (Temporal)  - Resp 18  - SpO2 97% Physical Exam ProceduresAttestation/Critical CarePatient Reevaluation: -------------------------------------------------------------ED Attending AddendumAttending Supervised: ResidentI saw and examined the patient. I agree with the findings and plan of care as documented in the resident's note except as noted below. Additional acute and/or chronic problems addressed: H/o dementia BIBEMS from home as MTR. Hx obtained from EMS. Witnessed mechanical fall backwards down her front steps. No preceding medical s/sx. No LOC. Currently at baseline mental status per family. Awake, alert, NAD, in C-collar, NTTP C/A/P, NTTP C/T/L-spine, MAE spontaneously w/ no pain on active ROM. Some soft tissue swelling to occipital scalp. [place] H/CS r/o ICH. Ivor Messier, MDEmergency Medicine AttendingPlease call with questions - available via MHB-------------------------------------------------------------Critical care provided by attending: critical careCritical care time (minutes): 20.Critical care time was exclusive of separately billable procedures and teaching time.Critical care was necessary to treat or prevent imminent or life-threatening deterioration of the following conditions:  Musculoskeletal.Critical care was time spent personally by me on the following activities: trauma, examination of patient, obtaining history from patient or surrogate, discussions with primary provider and ordering and review of radiographic studies.Comments as of 06/11/22 0034 Mon Jun 10, 2022 1814 Unable to tolerate [place] w/o medication due to dementia w/ behavioral disturbance - however due to fall with headstrike and possible occipital scalp hematoma will need CTH - will give small dose of droperidol [DS] 2346 [place] as interpreted by me with no ICH.  Remainder of workup completely unremarkable.  Patient has recovered from droperidol well, now back to baseline.  Had lengthy discussion with the daughter at bedside, patient and daughter were seen by care coordination, they would like to initiate home resources.  Daughter feels comfortable and prefers patient to be home, initially offered admission versus ED ops for care coordination, however, daughter declines.Initial referral sent by care coordination for establishment of resources at home for patient.  Will flagged chart for follow up nurses to re-evaluate the status of care coordination and home resources.Daughter Ralph Dowdy [phone] [AP]  Comments User Index[AP] Mosetta Anis, MD[DS] Ivor Messier, MD   Clinical Impressions as of 06/11/22 0034 Accident due to mechanical fall without injury, initial encounter Fall, initial encounter  ED DispositionDischarge Policastro, Alexa, MDResident02/12/24 1704 [place] Libman,  Kaela Beitz, MDOnalee Hua/12/24 1735 Willadean [place], Ivor Messier/13/24 [phone]

## 2022-06-11 NOTE — ED Notes
 1910 Verbal report received from off-going RN. This RN assumed care of PT at this time. Pt awaiting soma bed, pending UA and labs ant [place] imaging Danielle Campos is a 88 y.o. female with chief complaint of Fall (Fall backwards down six steps. +head strike with large occipital hematoma. +eloquis. Pt has dementia at baseline, is altered now)Pertinent medical Hx: Past Medical History: Diagnosis Date  Cancer (HC Code) (HC CODE) (HC Code)   endometrial ca w/ lung mets - chemo  Dementia (HC Code)   Diabetes mellitus (HC Code)   Fall at home   multiple  Hypercholesteremia   Hypertension   Myxoma of heart   Stroke (HC Code)   [place] Head Cervical Spine wo IV Contrast ([facility])(NOT [facility]    (Results Pending) ED Diagnostic Ultrasound Fast    (Results Pending) CXR    (Results Pending) Pelvis    (Results Pending)  1927 Daughter at bedside2019 Pt medicated by other [id number] Pt back for imaging2300 Prrovider notified, pt requesting to go home, daughter spoke with Social worker for care at home with PT and OT. 2330 Provider at bedside

## 2022-06-17 NOTE — ED Notes
3:03 PM

## 2022-07-29 DEATH — deceased

## 2022-08-28 DEATH — deceased
